# Patient Record
Sex: Female | Born: 1960 | Race: Black or African American | Hispanic: No | Marital: Single | Smoking: Former smoker
Health system: Southern US, Community
[De-identification: ages and names within clinical notes are randomized; demographics above are authoritative.]

## PROBLEM LIST (undated history)

## (undated) DIAGNOSIS — Z9114 Patient's other noncompliance with medication regimen: Secondary | ICD-10-CM

## (undated) DIAGNOSIS — F329 Major depressive disorder, single episode, unspecified: Secondary | ICD-10-CM

## (undated) DIAGNOSIS — F32A Depression, unspecified: Secondary | ICD-10-CM

## (undated) DIAGNOSIS — I10 Essential (primary) hypertension: Secondary | ICD-10-CM

## (undated) DIAGNOSIS — Z9289 Personal history of other medical treatment: Secondary | ICD-10-CM

## (undated) DIAGNOSIS — F17210 Nicotine dependence, cigarettes, uncomplicated: Secondary | ICD-10-CM

## (undated) DIAGNOSIS — Z91148 Patient's other noncompliance with medication regimen for other reason: Secondary | ICD-10-CM

## (undated) HISTORY — DX: Essential (primary) hypertension: I10

## (undated) HISTORY — DX: Nicotine dependence, cigarettes, uncomplicated: F17.210

## (undated) HISTORY — DX: Major depressive disorder, single episode, unspecified: F32.9

## (undated) HISTORY — PX: CHOLECYSTECTOMY: SHX55

## (undated) HISTORY — DX: Depression, unspecified: F32.A

---

## 2001-10-05 ENCOUNTER — Other Ambulatory Visit: Admission: RE | Admit: 2001-10-05 | Discharge: 2001-10-05 | Payer: Self-pay | Admitting: Obstetrics and Gynecology

## 2004-11-25 ENCOUNTER — Ambulatory Visit: Payer: Self-pay | Admitting: Family Medicine

## 2005-03-22 ENCOUNTER — Ambulatory Visit: Payer: Self-pay | Admitting: Family Medicine

## 2005-07-13 ENCOUNTER — Ambulatory Visit: Payer: Self-pay | Admitting: Family Medicine

## 2005-09-16 ENCOUNTER — Ambulatory Visit: Payer: Self-pay | Admitting: Family Medicine

## 2006-02-06 ENCOUNTER — Ambulatory Visit: Payer: Self-pay | Admitting: Family Medicine

## 2006-04-10 ENCOUNTER — Ambulatory Visit: Payer: Self-pay | Admitting: Family Medicine

## 2006-04-14 ENCOUNTER — Ambulatory Visit (HOSPITAL_COMMUNITY): Admission: RE | Admit: 2006-04-14 | Discharge: 2006-04-14 | Payer: Self-pay | Admitting: Family Medicine

## 2006-10-30 ENCOUNTER — Ambulatory Visit: Payer: Self-pay | Admitting: Family Medicine

## 2006-11-09 ENCOUNTER — Ambulatory Visit (HOSPITAL_COMMUNITY): Admission: RE | Admit: 2006-11-09 | Discharge: 2006-11-09 | Payer: Self-pay | Admitting: Family Medicine

## 2006-11-09 ENCOUNTER — Ambulatory Visit: Payer: Self-pay | Admitting: Family Medicine

## 2006-12-14 ENCOUNTER — Ambulatory Visit: Payer: Self-pay | Admitting: Family Medicine

## 2007-02-21 ENCOUNTER — Encounter (INDEPENDENT_AMBULATORY_CARE_PROVIDER_SITE_OTHER): Payer: Self-pay | Admitting: *Deleted

## 2007-02-21 LAB — CONVERTED CEMR LAB: Pap Smear: NORMAL

## 2007-12-06 ENCOUNTER — Encounter: Payer: Self-pay | Admitting: Family Medicine

## 2008-02-27 ENCOUNTER — Ambulatory Visit: Payer: Self-pay | Admitting: Family Medicine

## 2008-02-29 ENCOUNTER — Encounter: Payer: Self-pay | Admitting: Family Medicine

## 2008-02-29 LAB — CONVERTED CEMR LAB
BUN: 11 mg/dL (ref 6–23)
Calcium: 9.3 mg/dL (ref 8.4–10.5)
Chloride: 107 meq/L (ref 96–112)
Creatinine, Ser: 0.95 mg/dL (ref 0.40–1.20)
Glucose, Bld: 106 mg/dL — ABNORMAL HIGH (ref 70–99)
Hemoglobin: 12.7 g/dL (ref 12.0–15.0)
Lymphocytes Relative: 39 % (ref 12–46)
MCHC: 32.3 g/dL (ref 30.0–36.0)
Monocytes Absolute: 0.4 10*3/uL (ref 0.1–1.0)
Monocytes Relative: 9 % (ref 3–12)
Neutro Abs: 2.2 10*3/uL (ref 1.7–7.7)
Neutrophils Relative %: 51 % (ref 43–77)
Platelets: 180 10*3/uL (ref 150–400)
RBC: 5.22 M/uL — ABNORMAL HIGH (ref 3.87–5.11)
Sodium: 139 meq/L (ref 135–145)
WBC: 4.3 10*3/uL (ref 4.0–10.5)

## 2008-03-03 ENCOUNTER — Ambulatory Visit (HOSPITAL_COMMUNITY): Admission: RE | Admit: 2008-03-03 | Discharge: 2008-03-03 | Payer: Self-pay | Admitting: Family Medicine

## 2008-03-06 ENCOUNTER — Other Ambulatory Visit: Admission: RE | Admit: 2008-03-06 | Discharge: 2008-03-06 | Payer: Self-pay | Admitting: Obstetrics and Gynecology

## 2008-04-02 ENCOUNTER — Encounter (INDEPENDENT_AMBULATORY_CARE_PROVIDER_SITE_OTHER): Payer: Self-pay | Admitting: *Deleted

## 2008-04-02 DIAGNOSIS — I10 Essential (primary) hypertension: Secondary | ICD-10-CM | POA: Insufficient documentation

## 2008-04-02 DIAGNOSIS — L659 Nonscarring hair loss, unspecified: Secondary | ICD-10-CM | POA: Insufficient documentation

## 2008-04-02 DIAGNOSIS — F3289 Other specified depressive episodes: Secondary | ICD-10-CM | POA: Insufficient documentation

## 2008-04-02 DIAGNOSIS — F329 Major depressive disorder, single episode, unspecified: Secondary | ICD-10-CM

## 2008-04-03 ENCOUNTER — Encounter: Payer: Self-pay | Admitting: Family Medicine

## 2008-04-03 ENCOUNTER — Ambulatory Visit: Payer: Self-pay | Admitting: Family Medicine

## 2008-04-15 ENCOUNTER — Ambulatory Visit: Payer: Self-pay | Admitting: Family Medicine

## 2008-05-06 ENCOUNTER — Emergency Department (HOSPITAL_COMMUNITY): Admission: EM | Admit: 2008-05-06 | Discharge: 2008-05-06 | Payer: Self-pay | Admitting: Emergency Medicine

## 2008-05-15 ENCOUNTER — Ambulatory Visit: Payer: Self-pay | Admitting: Family Medicine

## 2008-08-15 ENCOUNTER — Encounter: Payer: Self-pay | Admitting: Family Medicine

## 2008-09-22 ENCOUNTER — Ambulatory Visit: Payer: Self-pay | Admitting: Family Medicine

## 2008-09-22 DIAGNOSIS — T7840XA Allergy, unspecified, initial encounter: Secondary | ICD-10-CM | POA: Insufficient documentation

## 2008-10-22 ENCOUNTER — Encounter: Payer: Self-pay | Admitting: Family Medicine

## 2008-10-23 ENCOUNTER — Ambulatory Visit: Payer: Self-pay | Admitting: Family Medicine

## 2008-12-15 ENCOUNTER — Encounter: Payer: Self-pay | Admitting: Family Medicine

## 2009-02-02 ENCOUNTER — Ambulatory Visit: Payer: Self-pay | Admitting: Family Medicine

## 2009-04-20 ENCOUNTER — Other Ambulatory Visit: Admission: RE | Admit: 2009-04-20 | Discharge: 2009-04-20 | Payer: Self-pay | Admitting: Obstetrics and Gynecology

## 2009-04-22 ENCOUNTER — Ambulatory Visit (HOSPITAL_COMMUNITY): Admission: RE | Admit: 2009-04-22 | Discharge: 2009-04-22 | Payer: Self-pay | Admitting: Obstetrics & Gynecology

## 2009-05-11 ENCOUNTER — Emergency Department (HOSPITAL_COMMUNITY): Admission: EM | Admit: 2009-05-11 | Discharge: 2009-05-11 | Payer: Self-pay | Admitting: Emergency Medicine

## 2009-05-13 ENCOUNTER — Telehealth: Payer: Self-pay | Admitting: Family Medicine

## 2009-05-15 ENCOUNTER — Encounter: Payer: Self-pay | Admitting: Family Medicine

## 2009-06-22 ENCOUNTER — Emergency Department (HOSPITAL_COMMUNITY): Admission: EM | Admit: 2009-06-22 | Discharge: 2009-06-22 | Payer: Self-pay | Admitting: Emergency Medicine

## 2010-01-08 ENCOUNTER — Telehealth: Payer: Self-pay | Admitting: Family Medicine

## 2010-01-08 ENCOUNTER — Ambulatory Visit: Payer: Self-pay | Admitting: Family Medicine

## 2010-01-08 DIAGNOSIS — R5381 Other malaise: Secondary | ICD-10-CM | POA: Insufficient documentation

## 2010-01-08 DIAGNOSIS — R5383 Other fatigue: Secondary | ICD-10-CM

## 2010-01-08 LAB — CONVERTED CEMR LAB
ALT: <8 units/L (ref 0–35)
BUN: 11 mg/dL (ref 6–23)
Basophils Relative: 1 % (ref 0–1)
Chloride: 105 meq/L (ref 96–112)
Glucose, Bld: 85 mg/dL (ref 70–99)
HCT: 37.9 % (ref 36.0–46.0)
Hemoglobin: 12 g/dL (ref 12.0–15.0)
Lymphocytes Relative: 31 % (ref 12–46)
Lymphs Abs: 1.5 10*3/uL (ref 0.7–4.0)
MCV: 76.3 fL — ABNORMAL LOW (ref 78.0–100.0)
Monocytes Absolute: 0.3 10*3/uL (ref 0.1–1.0)
Monocytes Relative: 7 % (ref 3–12)
Neutrophils Relative %: 62 % (ref 43–77)
Platelets: 206 10*3/uL (ref 150–400)
Potassium: 4.5 meq/L (ref 3.5–5.3)
RDW: 16.4 % — ABNORMAL HIGH (ref 11.5–15.5)
TSH: 1.226 microintl units/mL (ref 0.350–4.500)
Triglycerides: 74 mg/dL (ref <150)
VLDL: 15 mg/dL (ref 0–40)

## 2010-01-12 ENCOUNTER — Telehealth: Payer: Self-pay | Admitting: Family Medicine

## 2010-02-02 ENCOUNTER — Telehealth: Payer: Self-pay | Admitting: Family Medicine

## 2010-08-19 ENCOUNTER — Ambulatory Visit: Payer: Self-pay | Admitting: Family Medicine

## 2010-09-16 ENCOUNTER — Ambulatory Visit: Payer: Self-pay | Admitting: Family Medicine

## 2010-09-16 DIAGNOSIS — L723 Sebaceous cyst: Secondary | ICD-10-CM

## 2010-09-16 LAB — CONVERTED CEMR LAB
ALT: <8 units/L (ref 0–35)
AST: 11 units/L (ref 0–37)
Albumin: 4.3 g/dL (ref 3.5–5.2)
Alkaline Phosphatase: 61 units/L (ref 39–117)
Basophils Absolute: 0 10*3/uL (ref 0.0–0.1)
Basophils Relative: 1 % (ref 0–1)
Bilirubin, Direct: <0.1 mg/dL (ref 0.0–0.3)
Chloride: 105 meq/L (ref 96–112)
Cholesterol: 144 mg/dL (ref 0–200)
Eosinophils Absolute: 0 10*3/uL (ref 0.0–0.7)
Eosinophils Relative: 1 % (ref 0–5)
Hemoglobin: 12 g/dL (ref 12.0–15.0)
Lymphocytes Relative: 37 % (ref 12–46)
Lymphs Abs: 1.8 10*3/uL (ref 0.7–4.0)
MCV: 75.5 fL — ABNORMAL LOW (ref 78.0–100.0)
Neutro Abs: 2.6 10*3/uL (ref 1.7–7.7)
Neutrophils Relative %: 55 % (ref 43–77)
Platelets: 204 10*3/uL (ref 150–400)
RDW: 17.2 % — ABNORMAL HIGH (ref 11.5–15.5)
TSH: 1.47 microintl units/mL (ref 0.350–4.500)
VLDL: 14 mg/dL (ref 0–40)
WBC: 4.7 10*3/uL (ref 4.0–10.5)

## 2010-09-17 ENCOUNTER — Encounter: Payer: Self-pay | Admitting: Family Medicine

## 2010-11-18 ENCOUNTER — Ambulatory Visit: Payer: Self-pay | Admitting: Family Medicine

## 2010-11-19 ENCOUNTER — Encounter: Payer: Self-pay | Admitting: Family Medicine

## 2010-12-26 ENCOUNTER — Encounter: Payer: Self-pay | Admitting: Family Medicine

## 2011-01-04 NOTE — Letter (Signed)
Summary: consults  consults   Imported By: Curtis Sites 05/07/2010 10:33:00  _____________________________________________________________________  External Attachment:    Type:   Image     Comment:   External Document

## 2011-01-04 NOTE — Assessment & Plan Note (Signed)
Summary: OV   Vital Signs:  Patient profile:   50 year old female Height:      63.5 inches Weight:      167 pounds BMI:     29.22 O2 Sat:      95 % Pulse rate:   87 / minute Pulse rhythm:   regular Resp:     16 per minute BP sitting:   150 / 90  (left arm)  Vitals Entered By: Everitt Amber (January 08, 2010 10:22 AM)  Nutrition Counseling: Patient's BMI is greater than 25 and therefore counseled on weight management options. CC: sinus pressure, eyes watering, stress, ran out of bp pills   CC:  sinus pressure, eyes watering, stress, and ran out of bp pills.  History of Present Illness: Reports  that tshe has not been doing well. Denies recent fever or chills. Denies sinus pressure, nasal congestion , ear pain or sore throat. Denies chest congestion, or cough productive of sputum. Denies chest pain, palpitations, PND, orthopnea or leg swelling. Denies abdominal pain, nausea, vomitting, diarrhea or constipation. Denies change in bowel movements or bloody stool. Denies dysuria , frequency, incontinence or hesitancy. Denies  joint pain, swelling, or reduced mobility. Denies headaches, vertigo, seizures. Reports increeased depression and anxiety, she is not suicidal or homicidal. Denies  rash, lesions, or itch.Her allopecia is unchanged     Preventive Screening-Counseling & Management  Alcohol-Tobacco     Smoking Cessation Counseling: yes  Current Medications (verified): 1)  Amlodipine Besylate 5 Mg Tabs (Amlodipine Besylate) .... Take 1 Tablet By Mouth Once A Day 2)  Prozac 40 Mg Caps (Fluoxetine Hcl) .... Take 1 Capsule By Mouth Once A Day 3)  Ibuprofen 800 Mg Tabs (Ibuprofen) .... One Tab By Mouth Tid  Allergies (verified): No Known Drug Allergies  Review of Systems      See HPI Eyes:  Denies discharge and red eye. Endo:  Denies cold intolerance, excessive hunger, excessive thirst, excessive urination, heat intolerance, polyuria, and weight change. Heme:  Denies  abnormal bruising and bleeding. Allergy:  Denies hives or rash and itching eyes.  Physical Exam  General:  Well-developed,well-nourished,in no acute distress; alert,appropriate and cooperative throughout examination HEENT: No facial asymmetry,  EOMI, No sinus tenderness, TM's Clear, oropharynx  pink and moist.   Chest: Clear to auscultation bilaterally.  CVS: S1, S2, No murmurs, No S3.   Abd: Soft, Nontender.  MS: Adequate ROM spine, hips, shoulders and knees.  Ext: No edema.   CNS: CN 2-12 intact, power tone and sensation normal throughout.   Skin: Intact, no visible lesions or rashes.  Psych: Good eye contact, normal affect.  Memory intact, not anxious or depressed appearing.    Impression & Recommendations:  Problem # 1:  CONTRACEPTIVE MANAGEMENT (ICD-V25.09) Assessment Comment Only  Orders: Gynecologic Referral (Gyn)  Problem # 2:  DEPRESSION (ICD-311) Assessment: Deteriorated  The following medications were removed from the medication list:    Fluoxetine Hcl 20 Mg Caps (Fluoxetine hcl) ..... One cap by mouth qd    Pristiq 50 Mg Xr24h-tab (Desvenlafaxine succinate) .Marland Kitchen... Take 1 tablet by mouth once a day Her updated medication list for this problem includes:    Prozac 40 Mg Caps (Fluoxetine hcl) .Marland Kitchen... Take 1 capsule by mouth once a day  Problem # 3:  HYPERTENSION (ICD-401.9) Assessment: Deteriorated  Her updated medication list for this problem includes:    Amlodipine Besylate 5 Mg Tabs (Amlodipine besylate) .Marland Kitchen... Take 1 tablet by mouth once a day  Orders: T-Basic Metabolic Panel [phone])  BP today: 150/90 Prior BP: 120/70 (02/02/2009)  Labs Reviewed: K+: 4.1 (02/29/2008) Creat: : 0.95 (02/29/2008)   Chol: 146 (02/29/2008)   HDL: 47 (02/29/2008)   LDL: 80 (02/29/2008)   TG: 96 (02/29/2008)  Problem # 4:  ALOPECIA (ICD-704.00) Assessment: Unchanged  Complete Medication List: 1)  Amlodipine Besylate 5 Mg Tabs (Amlodipine besylate) .... Take 1 tablet by  mouth once a day 2)  Prozac 40 Mg Caps (Fluoxetine hcl) .... Take 1 capsule by mouth once a day 3)  Ibuprofen 800 Mg Tabs (Ibuprofen) .... One tab by mouth tid 4)  Zyrtec Hives Relief 10 Mg Tabs (Cetirizine hcl) .... Take 1 tablet by mouth once a day  Other Orders: T-Hepatic Function [phone]) T-Lipid Profile [phone]) T-CBC w/Diff [phone]) T-TSH [phone]) Radiology Referral (Radiology)  Patient Instructions: 1)  Please schedule a follow-up appointment in 3.5 months. 2)  Tobacco is very bad for your health and your loved ones! You Should stop smoking!. 3)  Stop Smoking Tips: Choose a Quit date. Cut down before the Quit date. decide what you will do as a substitute when you feel the urge to smoke(gum,toothpick,exercise). 4)  BMP prior to visit, ICD-9: 5)  Hepatic Panel prior to visit, ICD-9: 6)  Lipid Panel prior to visit, ICD-9:   FASTING TODAY 7)  TSH prior to visit, ICD-9: 8)  CBC w/ Diff prior to visit, ICD-9: 9)  YOUR BLOOD PRESSURE IS HIGH PLS TASKE YOUR MEDS Prescriptions: ZYRTEC HIVES RELIEF 10 MG TABS (CETIRIZINE HCL) Take 1 tablet by mouth once a day  #30 x 4   Entered and Authorized by:   Syliva Overman MD   Signed by:   Syliva Overman MD on 01/08/2010   Method used:   Electronically to        [facility] [address] [phone]* (retail)       [address], [place]  [address]       Ph: [phone]       Fax: [phone]   RxID:   [id number]

## 2011-01-04 NOTE — Letter (Signed)
Summary: history and physical  history and physical   Imported By: Curtis Sites 05/07/2010 10:33:47  _____________________________________________________________________  External Attachment:    Type:   Image     Comment:   External Document

## 2011-01-04 NOTE — Letter (Signed)
Summary: progress notes  progress notes   Imported By: Curtis Sites 05/07/2010 10:34:48  _____________________________________________________________________  External Attachment:    Type:   Image     Comment:   External Document

## 2011-01-04 NOTE — Assessment & Plan Note (Signed)
Summary: office visit   Vital Signs:  Patient profile:   50 year old female Height:      63.5 inches Weight:      157.25 pounds BMI:     27.52 O2 Sat:      97 % Pulse rate:   66 / minute Pulse rhythm:   regular Resp:     16 per minute BP sitting:   110 / 70  (left arm)  Vitals Entered By: Everitt Amber LPN (September 16, 2010 10:52 AM)  Nutrition Counseling: Patient's BMI is greater than 25 and therefore counseled on weight management options. CC: Follow up chronic problems   CC:  Follow up chronic problems.  History of Present Illness: 1 week h/o painfukl redswelling on right side of face in front of the ear, cannot touch the area., no fever, chills or drainage. Pt otherwise has no concerns. ll. Denies recent fever or chills. Denies sinus pressure, nasal congestion , ear pain or sore throat. Denies chest congestion, or cough productive of sputum. Denies chest pain, palpitations, PND, orthopnea or leg swelling. Denies abdominal pain, nausea, vomitting, diarrhea or constipation. Denies change in bowel movements or bloody stool. Denies dysuria , frequency, incontinence or hesitancy. Denies  joint pain, swelling, or reduced mobility. Denies headaches, vertigo, seizures. Denies depression, anxiety or insomnia.Symtoms cobntrolled on medication.    Allergies (verified): No Known Drug Allergies  Review of Systems      See HPI General:  Complains of fatigue. Eyes:  Denies discharge and red eye. Endo:  Denies excessive thirst and excessive urination. Heme:  Denies abnormal bruising and bleeding. Allergy:  Complains of seasonal allergies; denies hives or rash and itching eyes.  Physical Exam  General:  Well-developed,well-nourished,in no acute distress; alert,appropriate and cooperative throughout examination HEENT: No facial asymmetry,  EOMI, No sinus tenderness, TM's Clear, oropharynx  pink and moist.   Chest: Clear to auscultation bilaterally. decreased air entry  throughout CVS: S1, S2, No murmurs, No S3.   Abd: Soft, Nontender.  MS: Adequate ROM spine, hips, shoulders and knees.  Ext: No edema.   CNS: CN 2-12 intact, power tone and sensation normal throughout.   Skin:cellulitis with infected cyst anterior to right ear, area involved approx 3 cm, improvement in allopecia Psych: Good eye contact, normal affect.  Memory intact, depressed appearing.    Impression & Recommendations:  Problem # 1:  SEBACEOUS CYST, INFECTED (ICD-706.2) Assessment Comment Only  Orders: Rocephin  250mg  (Z6109) Admin of Therapeutic Inj  intramuscular or subcutaneous (96372)Future Orders: Surgical Referral (Surgery) ... 09/17/2010  Problem # 2:  DEPRESSION (ICD-311) Assessment: Improved  Her updated medication list for this problem includes:    Prozac 40 Mg Caps (Fluoxetine hcl) .Marland Kitchen... Take 1 capsule by mouth once a day  Problem # 3:  HYPERTENSION (ICD-401.9) Assessment: Improved  Her updated medication list for this problem includes:    Amlodipine Besylate 5 Mg Tabs (Amlodipine besylate) .Marland Kitchen... Take 1 tablet by mouth once a day  BP today: 110/70 Prior BP: 170/98 (08/19/2010)  Labs Reviewed: K+: 4.5 (01/08/2010) Creat: : 0.89 (01/08/2010)   Chol: 169 (01/08/2010)   HDL: 45 (01/08/2010)   LDL: 109 (01/08/2010)   TG: 74 (01/08/2010)  Problem # 4:  ALOPECIA (ICD-704.00) Assessment: Improved  Complete Medication List: 1)  Amlodipine Besylate 5 Mg Tabs (Amlodipine besylate) .... Take 1 tablet by mouth once a day 2)  Prozac 40 Mg Caps (Fluoxetine hcl) .... Take 1 capsule by mouth once a day 3)  Ibuprofen 800  Mg Tabs (Ibuprofen) .... One tab by mouth tid 4)  Zyrtec Hives Relief 10 Mg Tabs (Cetirizine hcl) .... Take 1 tablet by mouth once a day 5)  Doxycycline Hyclate 100 Mg Caps (Doxycycline hyclate) .... Take 1 capsule by mouth two times a day 6)  Advil 200 Mg Tabs (Ibuprofen) .... 2 tablets evry 8 hours as needed for pain for the next 5 days  Patient  Instructions: 1)  F/U as before 2)  You have an infected cyst on your face . 3)  It is vital you get this drained soon, I will refer you to a surgeon. 4)  You will get an injection, of Rocephin in the office , and antibiotics are also sent to your pharmacy , yo need to start these today. 5)  Your blood pressure is good. 6)  We will give you a tylenol, in the office and 2 advils for pain, also and  you will get advil samples take 2 tablet every 8 hors for the next 3 to 5 days as needed for pain Prescriptions: ADVIL 200 MG TABS (IBUPROFEN) 2 tablets evry 8 hours as needed for pain for the next 5 days  #24 x 0   Entered and Authorized by:   Syliva Overman MD   Signed by:   Syliva Overman MD on 09/16/2010   Method used:   Samples Given   RxID:   [id number] DOXYCYCLINE HYCLATE 100 MG CAPS (DOXYCYCLINE HYCLATE) Take 1 capsule by mouth two times a day  #20 x 0   Entered and Authorized by:   Syliva Overman MD   Signed by:   Syliva Overman MD on 09/16/2010   Method used:   Electronically to        [facility]* (retail)       [address]       [place], [place]  [address]       Ph: [phone]       Fax: [phone]   RxID:   [id number]    Medication Administration  Injection # 1:    Medication: Rocephin  250mg     Diagnosis: SEBACEOUS CYST, INFECTED (ICD-706.2)    Route: IM    Site: RUOQ gluteus    Exp Date: 12/2012    Lot #: [id number]    Mfr: novaplus    Comments: 500 mg given    Patient tolerated injection without complications    Given by: Mauricia Area CMA (September 16, 2010 11:58 AM)  Orders Added: 1)  Est. Patient Level IV [96789] 2)  Rocephin  250mg  [J0696] 3)  Admin of Therapeutic Inj  intramuscular or subcutaneous [96372] 4)  Surgical Referral [Surgery]

## 2011-01-04 NOTE — Progress Notes (Signed)
Summary: [facility]  Phone Note Call from Patient   Summary of Call: [facility] CALLED AND SAID THAT HAD CALLED HE 1 LEFT MESSAGE TO CALL BACK AND THEN CALLED AGAIN AND # DISCONNECTED SO IF SHE NEEDS TO BE REFFERED AGAIN JUST REDO IT Initial call taken by: Lind Guest,  February 02, 2010 3:13 PM

## 2011-01-04 NOTE — Letter (Signed)
Summary: phone notes  phone notes   Imported By: Curtis Sites 05/07/2010 10:34:21  _____________________________________________________________________  External Attachment:    Type:   Image     Comment:   External Document

## 2011-01-04 NOTE — Assessment & Plan Note (Signed)
Summary: [facility]   Vital Signs:  Patient profile:   50 year old female Height:      63.5 inches Weight:      153.75 pounds BMI:     26.91 O2 Sat:      100 % on Room air Pulse rate:   82 / minute Pulse rhythm:   regular Resp:     16 per minute BP sitting:   170 / 98  (left arm)  Vitals Entered By: Adella Hare LPN (August 19, 2010 11:37 AM)  Nutrition Counseling: Patient's BMI is greater than 25 and therefore counseled on weight management options.  O2 Flow:  Room air CC: follow-up visit Is Patient Diabetic? No Pain Assessment Patient in pain? no      Comments did not bring meds to [facility]   CC:  follow-up visit.  History of Present Illness: Reports  thatsh has not been doing well, she is overwhelmed and stressed because of no money. She ran out of her bP meds 3 days ago. Denies recent fever or chills. Denies sinus pressure, nasal congestion , ear pain or sore throat. Denies chest congestion, or cough productive of sputum. Denies chest pain, palpitations, PND, orthopnea or leg swelling. Denies abdominal pain, nausea, vomitting, diarrhea or constipation. Denies change in bowel movements or bloody stool. Denies dysuria , frequency, incontinence or hesitancy. Denies  joint pain, swelling, or reduced mobility. Denies headaches, vertigo, seizures. Denies depression, anxiety or insomnia. Reports improvement in her allo[pecia.   Current Medications (verified): 1)  Amlodipine Besylate 5 Mg Tabs (Amlodipine Besylate) .... Take 1 Tablet By Mouth Once A Day 2)  Prozac 40 Mg Caps (Fluoxetine Hcl) .... Take 1 Capsule By Mouth Once A Day 3)  Ibuprofen 800 Mg Tabs (Ibuprofen) .... One Tab By Mouth Tid 4)  Zyrtec Hives Relief 10 Mg Tabs (Cetirizine Hcl) .... Take 1 Tablet By Mouth Once A Day  Allergies (verified): No Known Drug Allergies  Review of Systems      See HPI General:  Complains of fatigue and sleep disorder. Eyes:  Denies discharge, eye pain, and red eye. Psych:   Complains of anxiety, depression, and irritability.  Physical Exam  General:  Well-developed,well-nourished,in no acute distress; alert,appropriate and cooperative throughout examination HEENT: No facial asymmetry,  EOMI, No sinus tenderness, TM's Clear, oropharynx  pink and moist.   Chest: Clear to auscultation bilaterally. decreased air entry throughout CVS: S1, S2, No murmurs, No S3.   Abd: Soft, Nontender.  MS: Adequate ROM spine, hips, shoulders and knees.  Ext: No edema.   CNS: CN 2-12 intact, power tone and sensation normal throughout.   Skin: Intact, improvement in allopecia Psych: Good eye contact, normal affect.  Memory intact, depressed appearing.    Impression & Recommendations:  Problem # 1:  DEPRESSION (ICD-311) Assessment Deteriorated  Her updated medication list for this problem includes:    Prozac 40 Mg Caps (Fluoxetine hcl) .Marland Kitchen... Take 1 capsule by mouth once a day  Problem # 2:  ALOPECIA (ICD-704.00) Assessment: Improved  Problem # 3:  HYPERTENSION (ICD-401.9) Assessment: Deteriorated  Her updated medication list for this problem includes:    Amlodipine Besylate 5 Mg Tabs (Amlodipine besylate) .Marland Kitchen... Take 1 tablet by mouth once a day Check your Blood Pressure regularly. If it is above150/95  you should make an appointment.  BP today: 170/98 Prior BP: 150/90 (01/08/2010)  Labs Reviewed: K+: 4.5 (01/08/2010) Creat: : 0.89 (01/08/2010)   Chol: 169 (01/08/2010)   HDL: 45 (01/08/2010)  LDL: 109 (01/08/2010)   TG: 74 (01/08/2010)  Complete Medication List: 1)  Amlodipine Besylate 5 Mg Tabs (Amlodipine besylate) .... Take 1 tablet by mouth once a day 2)  Prozac 40 Mg Caps (Fluoxetine hcl) .... Take 1 capsule by mouth once a day 3)  Ibuprofen 800 Mg Tabs (Ibuprofen) .... One tab by mouth tid 4)  Zyrtec Hives Relief 10 Mg Tabs (Cetirizine hcl) .... Take 1 tablet by mouth once a day  Other Orders: Influenza Vaccine MCR (04540)  Patient Instructions: 1)   Follow up appointment in 5.33months 2)  Meds are sent in. 3)  I hope things get better   Orders Added: 1)  Influenza Vaccine MCR [00025] 2)  Est. Patient Level III [98119]    Influenza Vaccine    Vaccine Type: Fluvax MCR    Site: left deltoid    Mfr: novartis    Dose: 0.5 ml    Route: IM    Given by: Adella Hare LPN    Exp. Date: 205/2012    Lot #: [id number]    VIS given: 06/29/10 version given August 19, 2010.

## 2011-01-04 NOTE — Letter (Addendum)
Summary: med review sheet  med review sheet   Imported By: Rudene Anda 09/17/2010 16:24:37  _____________________________________________________________________  External Attachments:     1. Type:   Image          Comment:   External Document    2. Type:   Image          Comment:   External Document

## 2011-01-04 NOTE — Progress Notes (Signed)
Summary: meds  Phone Note Call from Patient   Summary of Call: needs her bp medicine and stress pills and sinus pills send to [facility] Initial call taken by: Lind Guest,  January 12, 2010 4:25 PM    Prescriptions: ZYRTEC HIVES RELIEF 10 MG TABS (CETIRIZINE HCL) Take 1 tablet by mouth once a day  #30 x 4   Entered by:   Everitt Amber   Authorized by:   Syliva Overman MD   Signed by:   Everitt Amber on 01/12/2010   Method used:   Electronically to        [facility]* (retail)       [address] [place], [place]  [address]       Ph: [phone]       Fax: [phone]   RxID:   [id number] PROZAC 40 MG CAPS (FLUOXETINE HCL) Take 1 capsule by mouth once a day  #30.0 Each x 4   Entered by:   Everitt Amber   Authorized by:   Syliva Overman MD   Signed by:   Everitt Amber on 01/12/2010   Method used:   Electronically to        [facility]* (retail)       [address] [place], [place]  [address]       Ph: [phone]       Fax: [phone]   RxID:   [id number] AMLODIPINE BESYLATE 5 MG TABS (AMLODIPINE BESYLATE) Take 1 tablet by mouth once a day  #30.0 Each x 4   Entered by:   Everitt Amber   Authorized by:   Syliva Overman MD   Signed by:   Everitt Amber on 01/12/2010   Method used:   Electronically to        [facility]* (retail)       [address] [place], [place]  [address]       Ph: [phone]       Fax: [phone]   RxID:   [id number]

## 2011-01-04 NOTE — Letter (Signed)
Summary: misc.  misc.   Imported By: Curtis Sites 05/07/2010 10:34:03  _____________________________________________________________________  External Attachment:    Type:   Image     Comment:   External Document

## 2011-01-04 NOTE — Letter (Signed)
Summary: demographic  demographic   Imported By: Curtis Sites 05/07/2010 10:36:23  _____________________________________________________________________  External Attachment:    Type:   Image     Comment:   External Document

## 2011-01-04 NOTE — Letter (Signed)
Summary: lab  lab   Imported By: Curtis Sites 05/07/2010 10:33:27  _____________________________________________________________________  External Attachment:    Type:   Image     Comment:   External Document

## 2011-01-04 NOTE — Letter (Signed)
Summary: xray  xray   Imported By: Curtis Sites 05/07/2010 10:36:40  _____________________________________________________________________  External Attachment:    Type:   Image     Comment:   External Document

## 2011-01-04 NOTE — Progress Notes (Signed)
Summary: REFERRAL  Phone Note Call from Patient   Summary of Call: NEEDS A REFERALL TO DR. Joana Reamer BACK AT [phone] Initial call taken by: Lind Guest,  January 08, 2010 1:33 PM  Follow-up for Phone Call        has to go for her birth control ( i believe it is an iud) been 63yrs, so she will need a referal Follow-up by: Worthy Keeler LPN,  January 08, 2010 1:37 PM  Additional Follow-up for Phone Call Additional follow up Details #1::        will add to ov Additional Follow-up by: Syliva Overman MD,  January 11, 2010 1:02 AM

## 2011-01-06 NOTE — Letter (Signed)
Summary: 1st missed letter  1st missed letter   Imported By: Lind Guest 11/19/2010 14:18:36  _____________________________________________________________________  External Attachment:    Type:   Image     Comment:   External Document

## 2011-02-01 ENCOUNTER — Encounter: Payer: Self-pay | Admitting: Family Medicine

## 2011-02-01 ENCOUNTER — Ambulatory Visit: Payer: Self-pay | Admitting: Family Medicine

## 2011-02-03 ENCOUNTER — Encounter: Payer: Self-pay | Admitting: Family Medicine

## 2011-02-08 ENCOUNTER — Encounter: Payer: Self-pay | Admitting: Family Medicine

## 2011-02-10 NOTE — Letter (Signed)
Summary: discharge letter  discharge letter   Imported By: Rudene Anda 02/03/2011 08:44:52  _____________________________________________________________________  External Attachment:    Type:   Image     Comment:   External Document

## 2011-02-10 NOTE — Letter (Signed)
Summary: no show letter   no show letter   Imported By: Rudene Anda 02/03/2011 08:52:41  _____________________________________________________________________  External Attachment:    Type:   Image     Comment:   External Document

## 2011-02-15 NOTE — Letter (Signed)
Summary: receipt certified mail  receipt certified mail   Imported By: Lind Guest 02/08/2011 07:57:29  _____________________________________________________________________  External Attachment:    Type:   Image     Comment:   External Document

## 2011-04-11 ENCOUNTER — Other Ambulatory Visit: Payer: Self-pay | Admitting: Family Medicine

## 2011-05-10 ENCOUNTER — Other Ambulatory Visit: Payer: Self-pay | Admitting: Family Medicine

## 2011-09-01 LAB — URINALYSIS, ROUTINE W REFLEX MICROSCOPIC
Glucose, UA: NEGATIVE
Specific Gravity, Urine: 1.01
pH: 6.5

## 2011-09-01 LAB — URINE MICROSCOPIC-ADD ON

## 2011-09-01 LAB — URINE CULTURE

## 2011-09-01 LAB — PREGNANCY, URINE: Preg Test, Ur: NEGATIVE

## 2012-03-30 ENCOUNTER — Other Ambulatory Visit (HOSPITAL_COMMUNITY): Payer: Self-pay

## 2012-04-04 DIAGNOSIS — Z9289 Personal history of other medical treatment: Secondary | ICD-10-CM

## 2012-04-04 HISTORY — DX: Personal history of other medical treatment: Z92.89

## 2012-04-06 ENCOUNTER — Ambulatory Visit (HOSPITAL_COMMUNITY)
Admission: RE | Admit: 2012-04-06 | Discharge: 2012-04-06 | Disposition: A | Discharge status: Home / Self Care | Payer: Medicaid Other | Source: Ambulatory Visit | Attending: Family Medicine | Admitting: Family Medicine

## 2012-04-06 DIAGNOSIS — I10 Essential (primary) hypertension: Secondary | ICD-10-CM | POA: Insufficient documentation

## 2012-04-06 DIAGNOSIS — R079 Chest pain, unspecified: Secondary | ICD-10-CM | POA: Insufficient documentation

## 2012-04-06 DIAGNOSIS — R9431 Abnormal electrocardiogram [ECG] [EKG]: Secondary | ICD-10-CM | POA: Insufficient documentation

## 2012-04-06 DIAGNOSIS — I517 Cardiomegaly: Secondary | ICD-10-CM

## 2012-04-06 NOTE — Progress Notes (Signed)
*  PRELIMINARY RESULTS* Echocardiogram 2D Echocardiogram has been performed.  Conrad [place] 04/06/2012, 9:29 AM

## 2013-11-27 ENCOUNTER — Emergency Department (HOSPITAL_COMMUNITY): Payer: Medicaid Other

## 2013-11-27 ENCOUNTER — Encounter (HOSPITAL_COMMUNITY): Payer: Self-pay | Admitting: Emergency Medicine

## 2013-11-27 ENCOUNTER — Emergency Department (HOSPITAL_COMMUNITY)
Admission: EM | Admit: 2013-11-27 | Discharge: 2013-11-27 | Disposition: A | Discharge status: Home / Self Care | Payer: Medicaid Other | Attending: Emergency Medicine | Admitting: Emergency Medicine

## 2013-11-27 DIAGNOSIS — Z791 Long term (current) use of non-steroidal anti-inflammatories (NSAID): Secondary | ICD-10-CM | POA: Insufficient documentation

## 2013-11-27 DIAGNOSIS — R51 Headache: Secondary | ICD-10-CM | POA: Insufficient documentation

## 2013-11-27 DIAGNOSIS — Z872 Personal history of diseases of the skin and subcutaneous tissue: Secondary | ICD-10-CM | POA: Insufficient documentation

## 2013-11-27 DIAGNOSIS — F329 Major depressive disorder, single episode, unspecified: Secondary | ICD-10-CM | POA: Insufficient documentation

## 2013-11-27 DIAGNOSIS — Z792 Long term (current) use of antibiotics: Secondary | ICD-10-CM | POA: Insufficient documentation

## 2013-11-27 DIAGNOSIS — Z9119 Patient's noncompliance with other medical treatment and regimen: Secondary | ICD-10-CM | POA: Insufficient documentation

## 2013-11-27 DIAGNOSIS — Z79899 Other long term (current) drug therapy: Secondary | ICD-10-CM | POA: Insufficient documentation

## 2013-11-27 DIAGNOSIS — F17200 Nicotine dependence, unspecified, uncomplicated: Secondary | ICD-10-CM | POA: Insufficient documentation

## 2013-11-27 DIAGNOSIS — F3289 Other specified depressive episodes: Secondary | ICD-10-CM | POA: Insufficient documentation

## 2013-11-27 DIAGNOSIS — J329 Chronic sinusitis, unspecified: Secondary | ICD-10-CM

## 2013-11-27 DIAGNOSIS — R110 Nausea: Secondary | ICD-10-CM | POA: Insufficient documentation

## 2013-11-27 DIAGNOSIS — G8929 Other chronic pain: Secondary | ICD-10-CM

## 2013-11-27 DIAGNOSIS — I10 Essential (primary) hypertension: Secondary | ICD-10-CM

## 2013-11-27 DIAGNOSIS — Z760 Encounter for issue of repeat prescription: Secondary | ICD-10-CM | POA: Insufficient documentation

## 2013-11-27 DIAGNOSIS — Z91199 Patient's noncompliance with other medical treatment and regimen due to unspecified reason: Secondary | ICD-10-CM | POA: Insufficient documentation

## 2013-11-27 HISTORY — DX: Patient's other noncompliance with medication regimen: Z91.14

## 2013-11-27 HISTORY — DX: Patient's other noncompliance with medication regimen for other reason: Z91.148

## 2013-11-27 HISTORY — DX: Personal history of other medical treatment: Z92.89

## 2013-11-27 LAB — POCT I-STAT, CHEM 8
Chloride: 102 mEq/L (ref 96–112)
Creatinine, Ser: 1.1 mg/dL (ref 0.50–1.10)
HCT: 41 % (ref 36.0–46.0)
Hemoglobin: 13.9 g/dL (ref 12.0–15.0)
Potassium: 3.4 mEq/L — ABNORMAL LOW (ref 3.5–5.1)
Sodium: 142 mEq/L (ref 135–145)

## 2013-11-27 LAB — URINALYSIS W MICROSCOPIC + REFLEX CULTURE
Bilirubin Urine: NEGATIVE
Glucose, UA: NEGATIVE mg/dL
Ketones, ur: NEGATIVE mg/dL
Nitrite: NEGATIVE
pH: 7.5 (ref 5.0–8.0)

## 2013-11-27 MED ORDER — OXYCODONE-ACETAMINOPHEN 5-325 MG PO TABS
1.0000 | ORAL_TABLET | Freq: Once | ORAL | Status: AC
  Administered 2013-11-27: 1 via ORAL
  Filled 2013-11-27: qty 1

## 2013-11-27 MED ORDER — ONDANSETRON 8 MG PO TBDP
8.0000 mg | ORAL_TABLET | Freq: Once | ORAL | Status: AC
  Administered 2013-11-27: 8 mg via ORAL
  Filled 2013-11-27: qty 1

## 2013-11-27 MED ORDER — AMLODIPINE BESYLATE 5 MG PO TABS: 5.0000 mg | ORAL_TABLET | Freq: Every day | ORAL | Status: DC

## 2013-11-27 NOTE — ED Notes (Signed)
Dr. Clarene Duke in to assess patient.  [facility], radiology, here to transport patient to CT.

## 2013-11-27 NOTE — ED Notes (Signed)
Patient c/o abdominal pain; Dr. Clarene Duke aware.  Patient is vague with symptoms and pain during MD assessment.  Patient states she needs to go to bathroom.

## 2013-11-27 NOTE — ED Provider Notes (Signed)
CSN: [id number]     Arrival date & time 11/27/13  1855 History   First MD Initiated Contact with Patient 11/27/13 1913     Chief Complaint  Patient presents with  . Headache  . Medication Refill    HPI Pt was seen at 1915. Per pt, c/o gradual onset and persistence of constant headache for the past 4 months.  Describes the headache as "pressure" and located in her forehead area. Has been associated with runny/stuffy nose, sinus congestion, nausea and "hot flashes" over the past 2 to 3 days. States she started to take OTC "sinus congestion medicine" for same. Pt states after starting the OTC "sinus medication" she had a neighbor check her BP and "she told me it was high." Endorses she has not taken her BP meds in over 1 year and is requesting a refill today.  Denies headache was sudden or maximal in onset or at any time.  Denies visual changes, no focal motor weakness, no tingling/numbness in extremities, no fevers, no neck pain, no rash, no CP/SOB, no abd pain, no vomiting/diarrhea.    Past Medical History  Diagnosis Date  . Depression   . Alopecia   . Cigarette smoker   . Hypertension   . Non compliance w medication regimen   . History of echocardiogram 04/2012    normal EF, no regional wall motion abnormalities   History reviewed. No pertinent past surgical history.   Family History  Problem Relation Age of Onset  . Diabetes Mother     DJD   History  Substance Use Topics  . Smoking status: Current Every Day Smoker  . Smokeless tobacco: Not on file  . Alcohol Use: No    Review of Systems ROS: Statement: All systems negative except as marked or noted in the HPI; Constitutional: Negative for fever and chills. ; ; Eyes: Negative for eye pain, redness and discharge. ; ; ENMT: Negative for ear pain, hoarseness, sore throat. +nasal congestion, sinus pressure. ; ; Cardiovascular: Negative for chest pain, palpitations, diaphoresis, dyspnea and peripheral edema. ; ; Respiratory: Negative  for cough, wheezing and stridor. ; ; Gastrointestinal: +nausea. Negative for vomiting, diarrhea, abdominal pain, blood in stool, hematemesis, jaundice and rectal bleeding. . ; ; Genitourinary: Negative for dysuria, flank pain and hematuria. ; ; Musculoskeletal: Negative for back pain and neck pain. Negative for swelling and trauma.; ; Skin: Negative for pruritus, rash, abrasions, blisters, bruising and skin lesion.; ; Neuro: +frontal headache. Negative for lightheadedness and neck stiffness. Negative for weakness, altered level of consciousness , altered mental status, extremity weakness, paresthesias, involuntary movement, seizure and syncope.      Allergies  Review of patient's allergies indicates no known allergies.  Home Medications   Current Outpatient Rx  Name  Route  Sig  Dispense  Refill  . amLODipine (NORVASC) 5 MG tablet   Oral   Take 5 mg by mouth daily. Take one tablet by mouth once a day          . cetirizine (ZYRTEC HIVES RELIEF) 10 MG tablet   Oral   Take 10 mg by mouth daily. Take one tablet by mouth once a day          . doxycycline (VIBRAMYCIN) 100 MG capsule   Oral   Take 100 mg by mouth daily. Take one tablet by mouth two times a day          . FLUoxetine (PROZAC) 40 MG capsule   Oral   Take 40  mg by mouth daily. Take one capsule  by mouth two times a day          . ibuprofen (ADVIL) 200 MG tablet   Oral   Take 200 mg by mouth every 8 (eight) hours as needed. Take two tablets by mouth every 8 hours as needed for pain for the next 5 days          . ibuprofen (ADVIL,MOTRIN) 800 MG tablet   Oral   Take 800 mg by mouth 3 (three) times daily. One tablet by mouth TID             BP 239/81  Pulse 83  Temp(Src) 98.2 F (36.8 C) (Oral)  Resp 18  Ht 5\' 4"  (1.626 m)  Wt 164 lb (74.39 kg)  BMI 28.14 kg/m2  SpO2 100% Physical Exam 1920: Physical examination:  Nursing notes reviewed; Vital signs and O2 SAT reviewed;  Constitutional: Well developed,  Well nourished, Well hydrated, In no acute distress; Head:  Normocephalic, atraumatic; Eyes: EOMI, PERRL, No scleral icterus; ENMT: TM's clear bilat. +edemetous nasal turbinates bilat with clear rhinorrhea. +tender to percuss frontal and maxillary sinuses.  Mouth and pharynx normal, Mucous membranes moist; Neck: Supple, Full range of motion, No lymphadenopathy; Cardiovascular: Regular rate and rhythm, No murmur, rub, or gallop; Respiratory: Breath sounds clear & equal bilaterally, No rales, rhonchi, wheezes.  Speaking full sentences with ease, Normal respiratory effort/excursion; Chest: Nontender, Movement normal; Abdomen: Soft, Nontender, Nondistended, Normal bowel sounds; Genitourinary: No CVA tenderness; Extremities: Pulses normal, No tenderness, No edema, No calf edema or asymmetry.; Neuro: AA&Ox3, Major CN grossly intact.  Speech clear. No facial droop. Climbs on and off stretcher easily by herself. Gait steady. No gross focal motor or sensory deficits in extremities.; Skin: Color normal, Warm, Dry.   ED Course  Procedures     EKG Interpretation   None       MDM  MDM Reviewed: previous chart, nursing note and vitals Interpretation: labs and CT scan     Ct Head Wo Contrast 11/27/2013   CLINICAL DATA:  Headache.  EXAM: CT HEAD WITHOUT CONTRAST  TECHNIQUE: Contiguous axial images were obtained from the base of the skull through the vertex without intravenous contrast.  COMPARISON:  None.  FINDINGS: The ventricles and sulci are within normal limits for age. There is no evidence of acute infarct, intracranial hemorrhage, mass, midline shift, or extra-axial collection. The orbits are unremarkable. There is partial opacification of a single posterior right ethmoid air cell. The visualized paranasal sinuses and mastoid air cells are otherwise clear. There is no evidence of acute fracture.  IMPRESSION: No acute intracranial abnormality.   Electronically Signed   By: Sebastian Ache   On: 11/27/2013  19:29     Results for orders placed during the hospital encounter of 11/27/13  URINALYSIS W MICROSCOPIC + REFLEX CULTURE      Result Value Range   Color, Urine YELLOW  YELLOW   APPearance HAZY (*) CLEAR   Specific Gravity, Urine 1.020  1.005 - 1.030   pH 7.5  5.0 - 8.0   Glucose, UA NEGATIVE  NEGATIVE mg/dL   Hgb urine dipstick SMALL (*) NEGATIVE   Bilirubin Urine NEGATIVE  NEGATIVE   Ketones, ur NEGATIVE  NEGATIVE mg/dL   Protein, ur 30 (*) NEGATIVE mg/dL   Urobilinogen, UA 0.2  0.0 - 1.0 mg/dL   Nitrite NEGATIVE  NEGATIVE   Leukocytes, UA NEGATIVE  NEGATIVE   WBC, UA 0-2  <3 WBC/hpf  RBC / HPF 3-6  <3 RBC/hpf   Bacteria, UA FEW (*) RARE   Squamous Epithelial / LPF RARE  RARE   Urine-Other AMORPHOUS URATES/PHOSPHATES    POCT I-STAT, CHEM 8      Result Value Range   Sodium 142  135 - 145 mEq/L   Potassium 3.4 (*) 3.5 - 5.1 mEq/L   Chloride 102  96 - 112 mEq/L   BUN 7  6 - 23 mg/dL   Creatinine, Ser 0.98  0.50 - 1.10 mg/dL   Glucose, Bld 119 (*) 70 - 99 mg/dL   Calcium, Ion 1.47  1.12 - 1.23 mmol/L   TCO2 28  0 - 100 mmol/L   Hemoglobin 13.9  12.0 - 15.0 g/dL   HCT 82.9  56.2 - 13.0 %     2120:  BP improved after pain control. Pt has had Hgb and protein in urine since 2009; per EPIC chart review.  Pt has been ambulatory while in the ED with steady gait, easy resps, NAD. Denies CP/SOB. Neuro exam unchanged. Has tol PO well without N/V. No stooling while in the ED. Abd remains benign. Pt states she feels better after meds and wants to go home now. Will refill pt's BP med; strongly encouraged to f/u with PMD for good continuity of care and control of her BP. Pt verb understanding. Dx and testing d/w pt.  Questions answered.  Verb understanding, agreeable to d/c home with outpt f/u.   Laray Anger, DO 11/30/13 2208

## 2013-11-27 NOTE — ED Notes (Signed)
Pt with c/o HA for couple of months per pt, states BP high couple days ago, states that she ran out of BP meds last year, also c/o hot flashes

## 2013-11-27 NOTE — ED Notes (Signed)
Patient c/o headache for several months; states has been using Sudafed for a few days and her neighbor took her BP and it was elevated.  Patient states that her neighbor told her not to go to sleep because she may die.  Patient c/o nausea.

## 2014-03-12 ENCOUNTER — Other Ambulatory Visit (HOSPITAL_COMMUNITY): Payer: Self-pay | Admitting: Nurse Practitioner

## 2014-03-12 DIAGNOSIS — Z1231 Encounter for screening mammogram for malignant neoplasm of breast: Secondary | ICD-10-CM

## 2014-03-18 ENCOUNTER — Ambulatory Visit (HOSPITAL_COMMUNITY): Payer: Medicaid Other

## 2014-03-18 IMAGING — CT CT HEAD W/O CM
1 series · 16 of 30 positions shown, 20 images · non-contrast
Comparison: None.

CLINICAL DATA: Headache.

EXAM:
CT HEAD WITHOUT CONTRAST
TECHNIQUE: Contiguous axial images were obtained from the base of the skull
through the vertex without intravenous contrast.

[Series 2: headseq 4.8 h37s · axial · 0.43mm/px · z∈[+84,+217]mm · 16 of 30 slices shown, 20 images]
[im 2/30  brain]
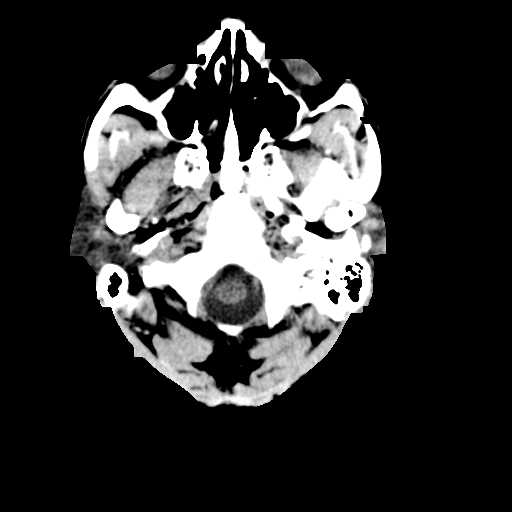
[im 2/30  bone]
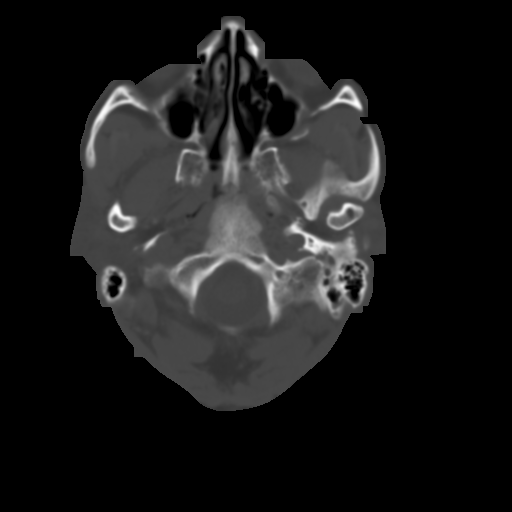
[im 4/30  brain]
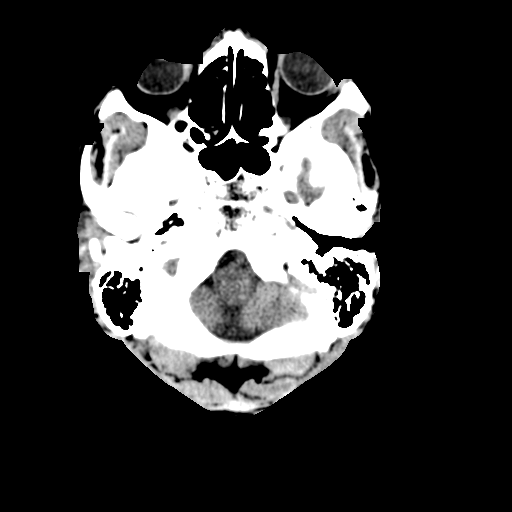
[im 6/30  brain]
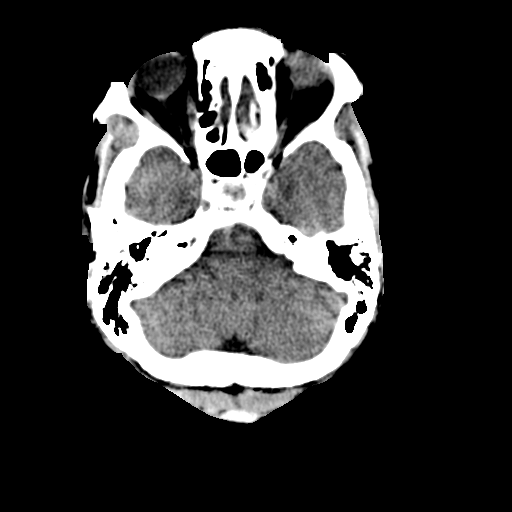
[im 8/30  brain]
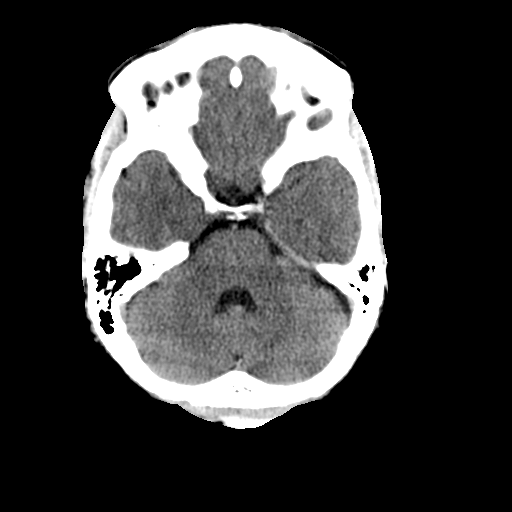
[im 9/30  brain]
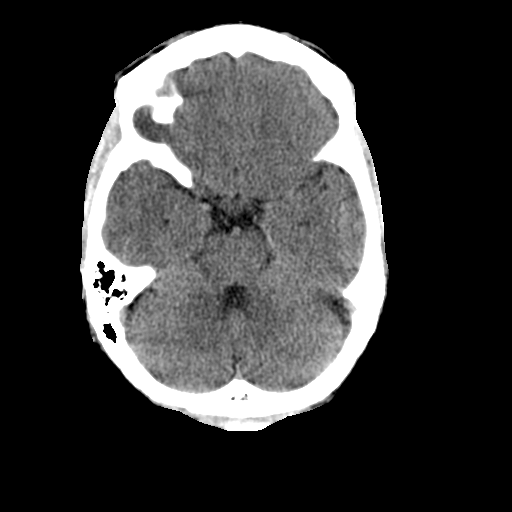
[im 9/30  bone]
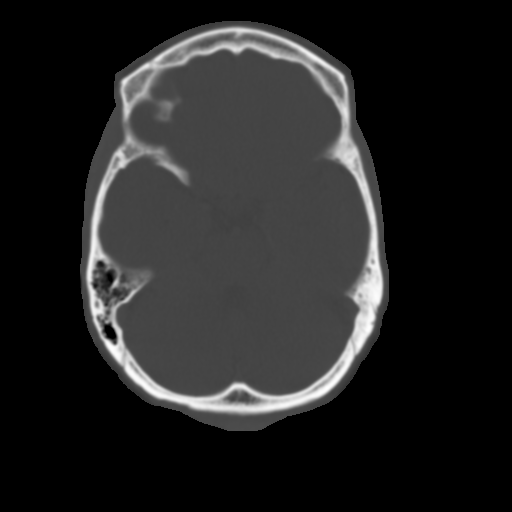
[im 11/30  brain]
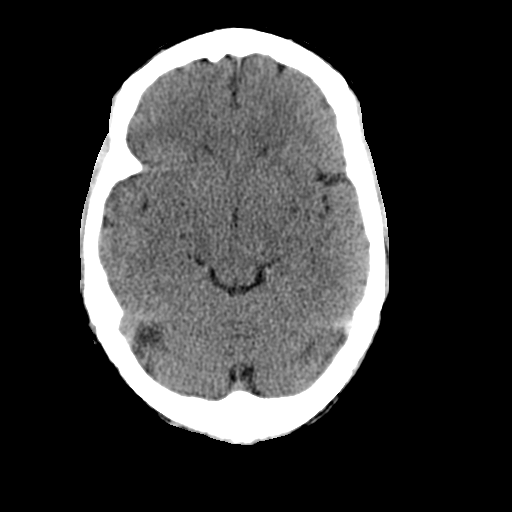
[im 13/30  brain]
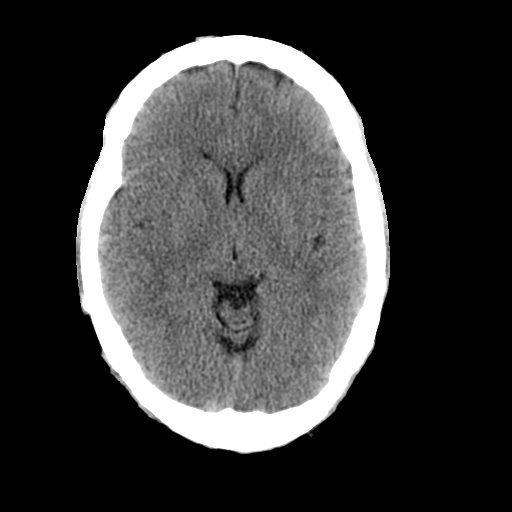
[im 15/30  brain]
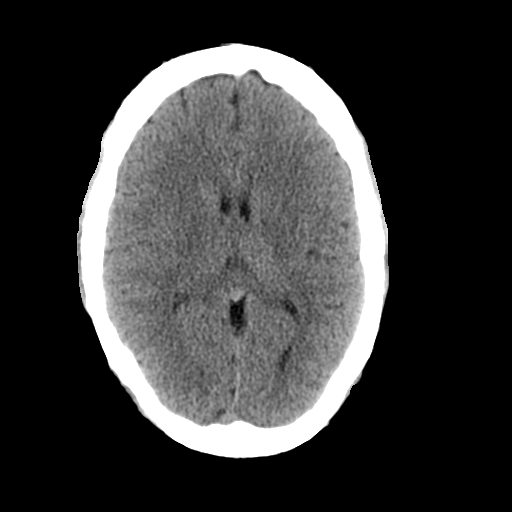
[im 16/30  brain]
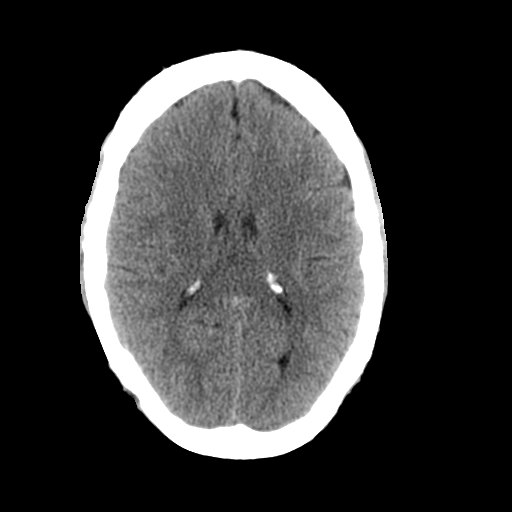
[im 16/30  bone]
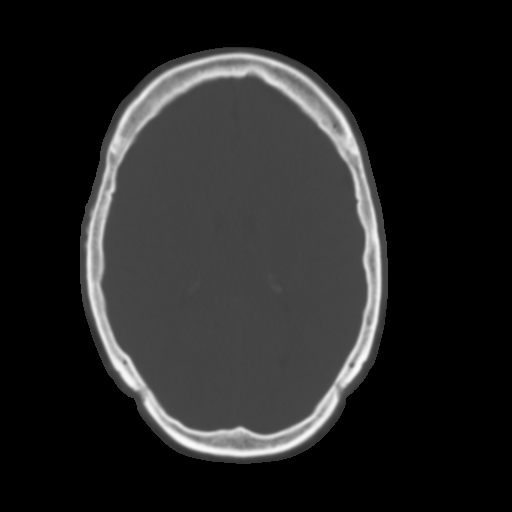
[im 18/30  brain]
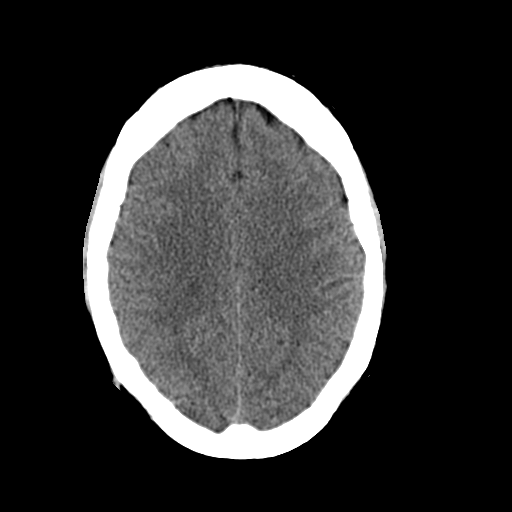
[im 20/30  brain]
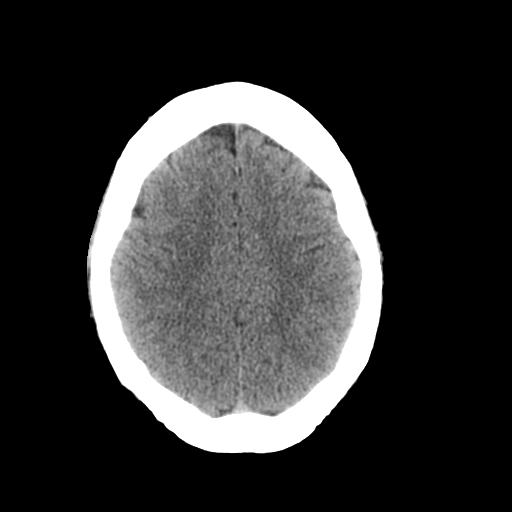
[im 22/30  brain]
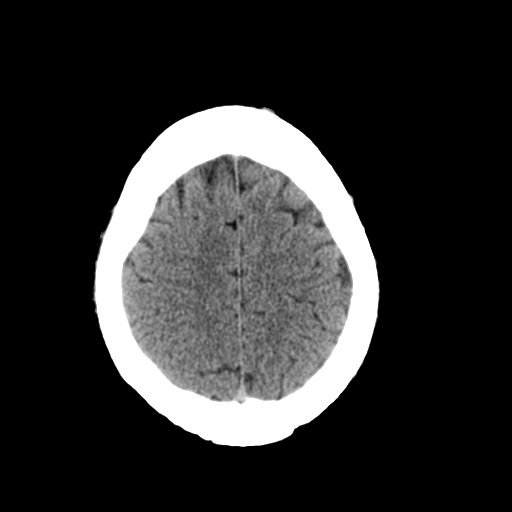
[im 23/30  brain]
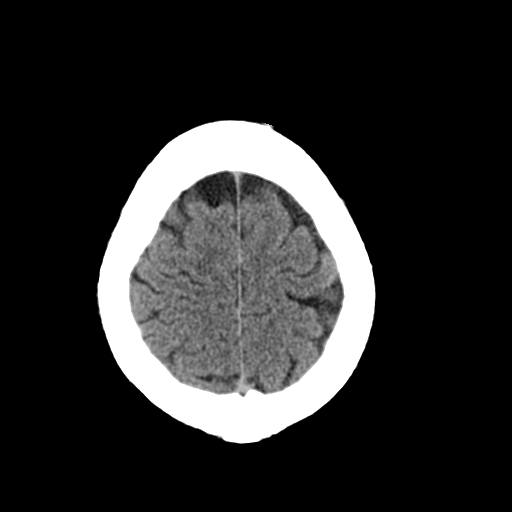
[im 23/30  bone]
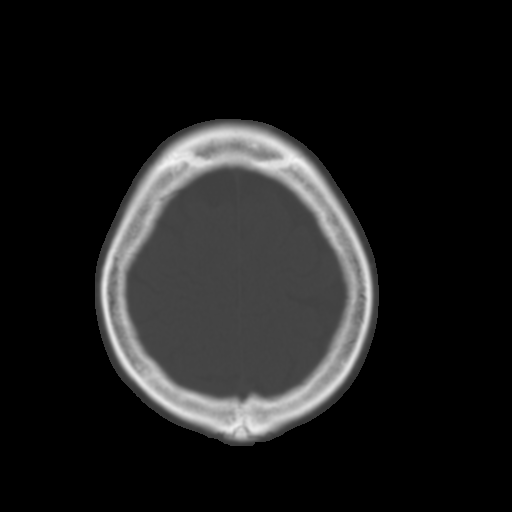
[im 25/30  brain]
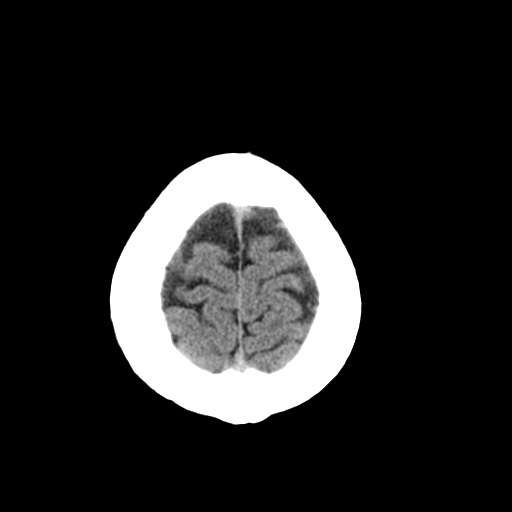
[im 27/30  brain]
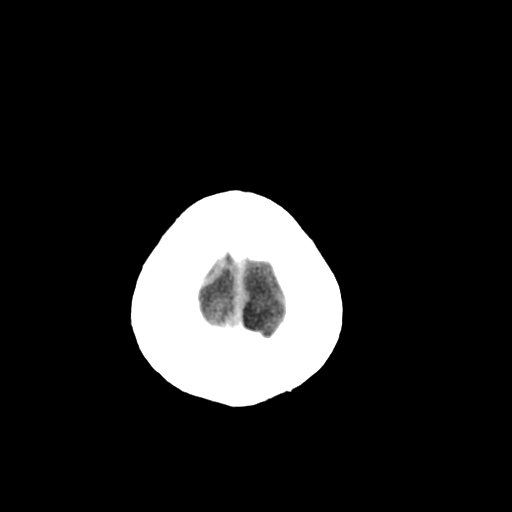
[im 29/30  brain]
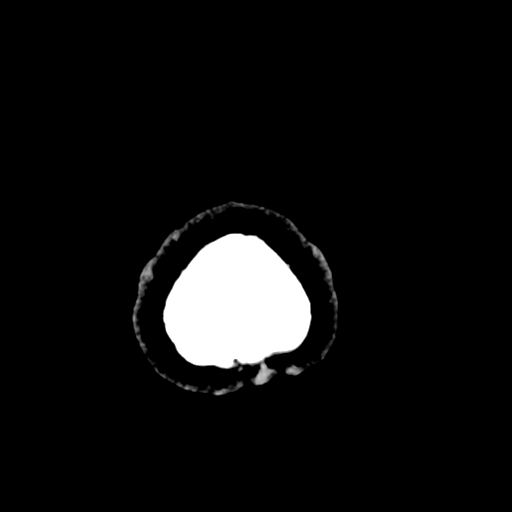

[16 of 30 positions shown; findings below may reference images not displayed]

FINDINGS: The ventricles and sulci are within normal limits for age. There is
no evidence of acute infarct, intracranial hemorrhage, mass, midline
shift, or extra-axial collection. The orbits are unremarkable. There
is partial opacification of a single posterior right ethmoid air
cell. The visualized paranasal sinuses and mastoid air cells are
otherwise clear. There is no evidence of acute fracture.
IMPRESSION: No acute intracranial abnormality.

## 2014-03-20 ENCOUNTER — Ambulatory Visit (HOSPITAL_COMMUNITY)
Admission: RE | Admit: 2014-03-20 | Discharge: 2014-03-20 | Disposition: A | Discharge status: Home / Self Care | Payer: Medicaid Other | Source: Ambulatory Visit | Attending: Nurse Practitioner | Admitting: Nurse Practitioner

## 2014-03-20 DIAGNOSIS — Z1231 Encounter for screening mammogram for malignant neoplasm of breast: Secondary | ICD-10-CM | POA: Insufficient documentation

## 2014-09-12 ENCOUNTER — Other Ambulatory Visit (HOSPITAL_COMMUNITY): Payer: Self-pay | Admitting: *Deleted

## 2014-09-12 DIAGNOSIS — R51 Headache: Principal | ICD-10-CM

## 2014-09-12 DIAGNOSIS — R519 Headache, unspecified: Secondary | ICD-10-CM

## 2014-09-17 ENCOUNTER — Ambulatory Visit (HOSPITAL_COMMUNITY): Payer: Medicaid Other | Attending: *Deleted

## 2016-07-23 LAB — BASIC METABOLIC PANEL: Glucose: 108 mg/dL

## 2017-07-17 ENCOUNTER — Emergency Department (HOSPITAL_COMMUNITY)
Admission: EM | Admit: 2017-07-17 | Discharge: 2017-07-17 | Disposition: A | Discharge status: Home / Self Care | Payer: Self-pay | Attending: Emergency Medicine | Admitting: Emergency Medicine

## 2017-07-17 ENCOUNTER — Emergency Department (HOSPITAL_COMMUNITY): Payer: Self-pay

## 2017-07-17 ENCOUNTER — Encounter (HOSPITAL_COMMUNITY): Payer: Self-pay

## 2017-07-17 DIAGNOSIS — I10 Essential (primary) hypertension: Secondary | ICD-10-CM | POA: Insufficient documentation

## 2017-07-17 DIAGNOSIS — B349 Viral infection, unspecified: Secondary | ICD-10-CM | POA: Insufficient documentation

## 2017-07-17 DIAGNOSIS — F17200 Nicotine dependence, unspecified, uncomplicated: Secondary | ICD-10-CM | POA: Insufficient documentation

## 2017-07-17 LAB — URINALYSIS, ROUTINE W REFLEX MICROSCOPIC
BACTERIA UA: NONE SEEN
BILIRUBIN URINE: NEGATIVE
Glucose, UA: NEGATIVE mg/dL
Ketones, ur: NEGATIVE mg/dL
LEUKOCYTES UA: NEGATIVE
Nitrite: NEGATIVE
Protein, ur: NEGATIVE mg/dL
Specific Gravity, Urine: 1.011 (ref 1.005–1.030)
pH: 8 (ref 5.0–8.0)

## 2017-07-17 LAB — COMPREHENSIVE METABOLIC PANEL
ALK PHOS: 67 U/L (ref 38–126)
ALT: 11 U/L — ABNORMAL LOW (ref 14–54)
AST: 17 U/L (ref 15–41)
Albumin: 4.3 g/dL (ref 3.5–5.0)
Anion gap: 8 (ref 5–15)
BUN: 6 mg/dL (ref 6–20)
CALCIUM: 9 mg/dL (ref 8.9–10.3)
CO2: 26 mmol/L (ref 22–32)
Chloride: 106 mmol/L (ref 101–111)
Creatinine, Ser: 0.86 mg/dL (ref 0.44–1.00)
GFR calc non Af Amer: >60 mL/min (ref >60)
Glucose, Bld: 108 mg/dL — ABNORMAL HIGH (ref 65–99)
Potassium: 3.5 mmol/L (ref 3.5–5.1)
Sodium: 140 mmol/L (ref 135–145)
TOTAL PROTEIN: 7.8 g/dL (ref 6.5–8.1)
Total Bilirubin: 0.7 mg/dL (ref 0.3–1.2)

## 2017-07-17 LAB — CBC WITH DIFFERENTIAL/PLATELET
Basophils Absolute: 0 10*3/uL (ref 0.0–0.1)
Basophils Relative: 0 %
Eosinophils Absolute: 0 10*3/uL (ref 0.0–0.7)
Eosinophils Relative: 0 %
HEMATOCRIT: 40.8 % (ref 36.0–46.0)
Hemoglobin: 13.1 g/dL (ref 12.0–15.0)
Lymphocytes Relative: 13 %
Lymphs Abs: 0.8 10*3/uL (ref 0.7–4.0)
MCH: 24.6 pg — ABNORMAL LOW (ref 26.0–34.0)
MCHC: 32.1 g/dL (ref 30.0–36.0)
MCV: 76.7 fL — ABNORMAL LOW (ref 78.0–100.0)
Monocytes Absolute: 0.6 10*3/uL (ref 0.1–1.0)
Monocytes Relative: 9 %
Neutro Abs: 5.3 10*3/uL (ref 1.7–7.7)
Neutrophils Relative %: 78 %
Platelets: 152 10*3/uL (ref 150–400)
RBC: 5.32 MIL/uL — ABNORMAL HIGH (ref 3.87–5.11)
RDW: 16.1 % — ABNORMAL HIGH (ref 11.5–15.5)
WBC: 6.7 10*3/uL (ref 4.0–10.5)

## 2017-07-17 LAB — CK: CK TOTAL: 83 U/L (ref 38–234)

## 2017-07-17 MED ORDER — AMLODIPINE BESYLATE 5 MG PO TABS
5.0000 mg | ORAL_TABLET | Freq: Once | ORAL | Status: AC
  Administered 2017-07-17: 5 mg via ORAL
  Filled 2017-07-17: qty 1

## 2017-07-17 MED ORDER — SODIUM CHLORIDE 0.9 % IV BOLUS (SEPSIS)
1000.0000 mL | Freq: Once | INTRAVENOUS | Status: AC
  Administered 2017-07-17: 1000 mL via INTRAVENOUS

## 2017-07-17 MED ORDER — AMLODIPINE BESYLATE 5 MG PO TABS: 5.0000 mg | ORAL_TABLET | Freq: Every day | ORAL | 1 refills | Status: DC

## 2017-07-17 MED ORDER — IBUPROFEN 400 MG PO TABS
600.0000 mg | ORAL_TABLET | Freq: Once | ORAL | Status: AC
  Administered 2017-07-17: 600 mg via ORAL
  Filled 2017-07-17: qty 2

## 2017-07-17 NOTE — ED Provider Notes (Signed)
Emergency Department Provider Note   I have reviewed the triage vital signs and the nursing notes.   HISTORY  Chief Complaint Generalized Body Aches   HPI Belinda Cunningham is a 56 y.o. female with PMH of HTN and depression presents to the ED with diffuse body aches, cough, and HA. Symptoms have been ongoing for the past 2 days. She reports contact with a sick grandchild recently. Denies any fevers or chills. She's had some associated loss of appetite but denies nausea, vomiting, diarrhea. No abdominal pain. Patient reports taking blood pressure medication the past but has been off of it since 2016 when she ran out of medication. No other new medications started recently. No radiation of symptoms. She is not having more muscle cramping in one place over another. Headache is been moderate and gradually worsening. No vision changes.    Past Medical History:  Diagnosis Date  . Alopecia   . Cigarette smoker   . Depression   . History of echocardiogram 04/2012   normal EF, no regional wall motion abnormalities  . Hypertension   . Non compliance w medication regimen     Patient Active Problem List   Diagnosis Date Noted  . SEBACEOUS CYST, INFECTED 09/16/2010  . FATIGUE 01/08/2010  . ALLERGY UNSPECIFIED NOT ELSEWHERE CLASSIFIED 09/22/2008  . DEPRESSION 04/02/2008  . HYPERTENSION 04/02/2008  . ALOPECIA 04/02/2008    History reviewed. No pertinent surgical history.  Current Outpatient Rx  . Order #: [id number] Class: Print    Allergies Patient has no known allergies.  Family History  Problem Relation Age of Onset  . Diabetes Mother        DJD    Social History Social History  Substance Use Topics  . Smoking status: Current Every Day Smoker  . Smokeless tobacco: Never Used  . Alcohol use No    Review of Systems  Constitutional: No fever/chills Eyes: No visual changes. ENT: No sore throat. Cardiovascular: Denies chest pain. Respiratory: Denies shortness of  breath. Positive cough.  Gastrointestinal: No abdominal pain.  No nausea, no vomiting.  No diarrhea.  No constipation. Genitourinary: Negative for dysuria. Musculoskeletal: Negative for back pain. Positive muscle aches.  Skin: Negative for rash. Neurological: Negative for focal weakness or numbness. Positive moderate HA.   10-point ROS otherwise negative.  ____________________________________________   PHYSICAL EXAM:  VITAL SIGNS: ED Triage Vitals  Enc Vitals Group     BP 07/17/17 1147 (S) (!) 187/104     Pulse Rate 07/17/17 1147 100     Resp 07/17/17 1147 16     Temp 07/17/17 1147 98.4 F (36.9 C)     Temp Source 07/17/17 1147 Oral     SpO2 07/17/17 1147 99 %     Weight 07/17/17 1145 140 lb (63.5 kg)     Height 07/17/17 1145 5\' 4"  (1.626 m)     Pain Score 07/17/17 1145 9   Constitutional: Alert and oriented. Well appearing and in no acute distress. Eyes: Conjunctivae are normal. Head: Atraumatic. Nose: No congestion/rhinnorhea. Mouth/Throat: Mucous membranes are slightly dry.  Neck: No stridor.  No meningeal signs.  Cardiovascular: Normal rate, regular rhythm. Good peripheral circulation. Grossly normal heart sounds.   Respiratory: Normal respiratory effort.  No retractions. Lungs CTAB. Gastrointestinal: Soft and nontender. No distention.  Musculoskeletal: No lower extremity tenderness nor edema. No gross deformities of extremities. Neurologic:  Normal speech and language. No gross focal neurologic deficits are appreciated.  Skin:  Skin is warm, dry and intact. No  rash noted.  ____________________________________________   LABS (all labs ordered are listed, but only abnormal results are displayed)  Labs Reviewed  COMPREHENSIVE METABOLIC PANEL - Abnormal; Notable for the following:       Result Value   Glucose, Bld 108 (*)    ALT 11 (*)    All other components within normal limits  CBC WITH DIFFERENTIAL/PLATELET - Abnormal; Notable for the following:    RBC 5.32  (*)    MCV 76.7 (*)    MCH 24.6 (*)    RDW 16.1 (*)    All other components within normal limits  URINALYSIS, ROUTINE W REFLEX MICROSCOPIC - Abnormal; Notable for the following:    Hgb urine dipstick SMALL (*)    Squamous Epithelial / LPF 0-5 (*)    All other components within normal limits  URINE CULTURE  CK   ____________________________________________  RADIOLOGY  Dg Chest 2 View  Result Date: 07/17/2017 CLINICAL DATA:  Cough EXAM: CHEST  2 VIEW COMPARISON:  None. FINDINGS: The lungs are clear. The heart size and pulmonary vascularity are normal. No adenopathy. There is midthoracic levoscoliosis. IMPRESSION: No edema or consolidation. Electronically Signed   By: Bretta Bang III M.D.   On: 07/17/2017 13:05    ____________________________________________   PROCEDURES  Procedure(s) performed:   Procedures  None ____________________________________________   INITIAL IMPRESSION / ASSESSMENT AND PLAN / ED COURSE  Pertinent labs & imaging results that were available during my care of the patient were reviewed by me and considered in my medical decision making (see chart for details).  Patient resents to the emergency department with cough and generalized muscle aches. She's had some associated fatigue and decreased appetite. No focal findings on exam. Normal vital signs the exception of very high blood pressure. Patient has been off of her amlodipine for the past 2 years. Will give dose here and plan to discharge home with medication refill. No indication by history or exam to suggest hypertensive emergency. Plan for IV fluids and lab work. Will follow chest x-ray.  01:45 PM Patient feeling better after IVF. Suspect viral infection with body aches and fatigue. No anemia or renal failure. No evidence of HTN emergency. Discussed the importance of HTN follow up and medication compliance. Has seen the health department in the past. Will discharge with contact information for  [employer] as well.  At this time, I do not feel there is any life-threatening condition present. I have reviewed and discussed all results (EKG, imaging, lab, urine as appropriate), exam findings with patient. I have reviewed nursing notes and appropriate previous records.  I feel the patient is safe to be discharged home without further emergent workup. Discussed usual and customary return precautions. Patient and family (if present) verbalize understanding and are comfortable with this plan.  Patient will follow-up with their primary care provider. If they do not have a primary care provider, information for follow-up has been provided to them. All questions have been answered.  ____________________________________________  FINAL CLINICAL IMPRESSION(S) / ED DIAGNOSES  Final diagnoses:  Viral syndrome  Essential hypertension     MEDICATIONS GIVEN DURING THIS VISIT:  Medications  sodium chloride 0.9 % bolus 1,000 mL (0 mLs Intravenous Stopped 07/17/17 1405)  amLODipine (NORVASC) tablet 5 mg (5 mg Oral Given 07/17/17 1229)  ibuprofen (ADVIL,MOTRIN) tablet 600 mg (600 mg Oral Given 07/17/17 1229)     NEW OUTPATIENT MEDICATIONS STARTED DURING THIS VISIT:  Discharge Medication List as of 07/17/2017  1:48 PM  START taking these medications   Details  amLODipine (NORVASC) 5 MG tablet Take 1 tablet (5 mg total) by mouth daily., Starting Mon 07/17/2017, Until Fri 09/15/2017, Print          Note:  This document was prepared using Dragon voice recognition software and may include unintentional dictation errors.  Alona BeneJoshua Staci Dack, MD Emergency Medicine   Anab Vivar, Arlyss RepressJoshua G, MD 07/17/17 [phone]

## 2017-07-17 NOTE — ED Triage Notes (Signed)
Reports of body aches, loss of appetite and headache. Has not been taking BP medications.

## 2017-07-17 NOTE — Discharge Instructions (Signed)

## 2017-07-19 LAB — URINE CULTURE: SPECIAL REQUESTS: NORMAL

## 2017-11-05 IMAGING — DX DG CHEST 2V
2 series · 2 of 2 positions shown · non-contrast
Comparison: None.

CLINICAL DATA: Cough

EXAM:
CHEST  2 VIEW

[chest pa]
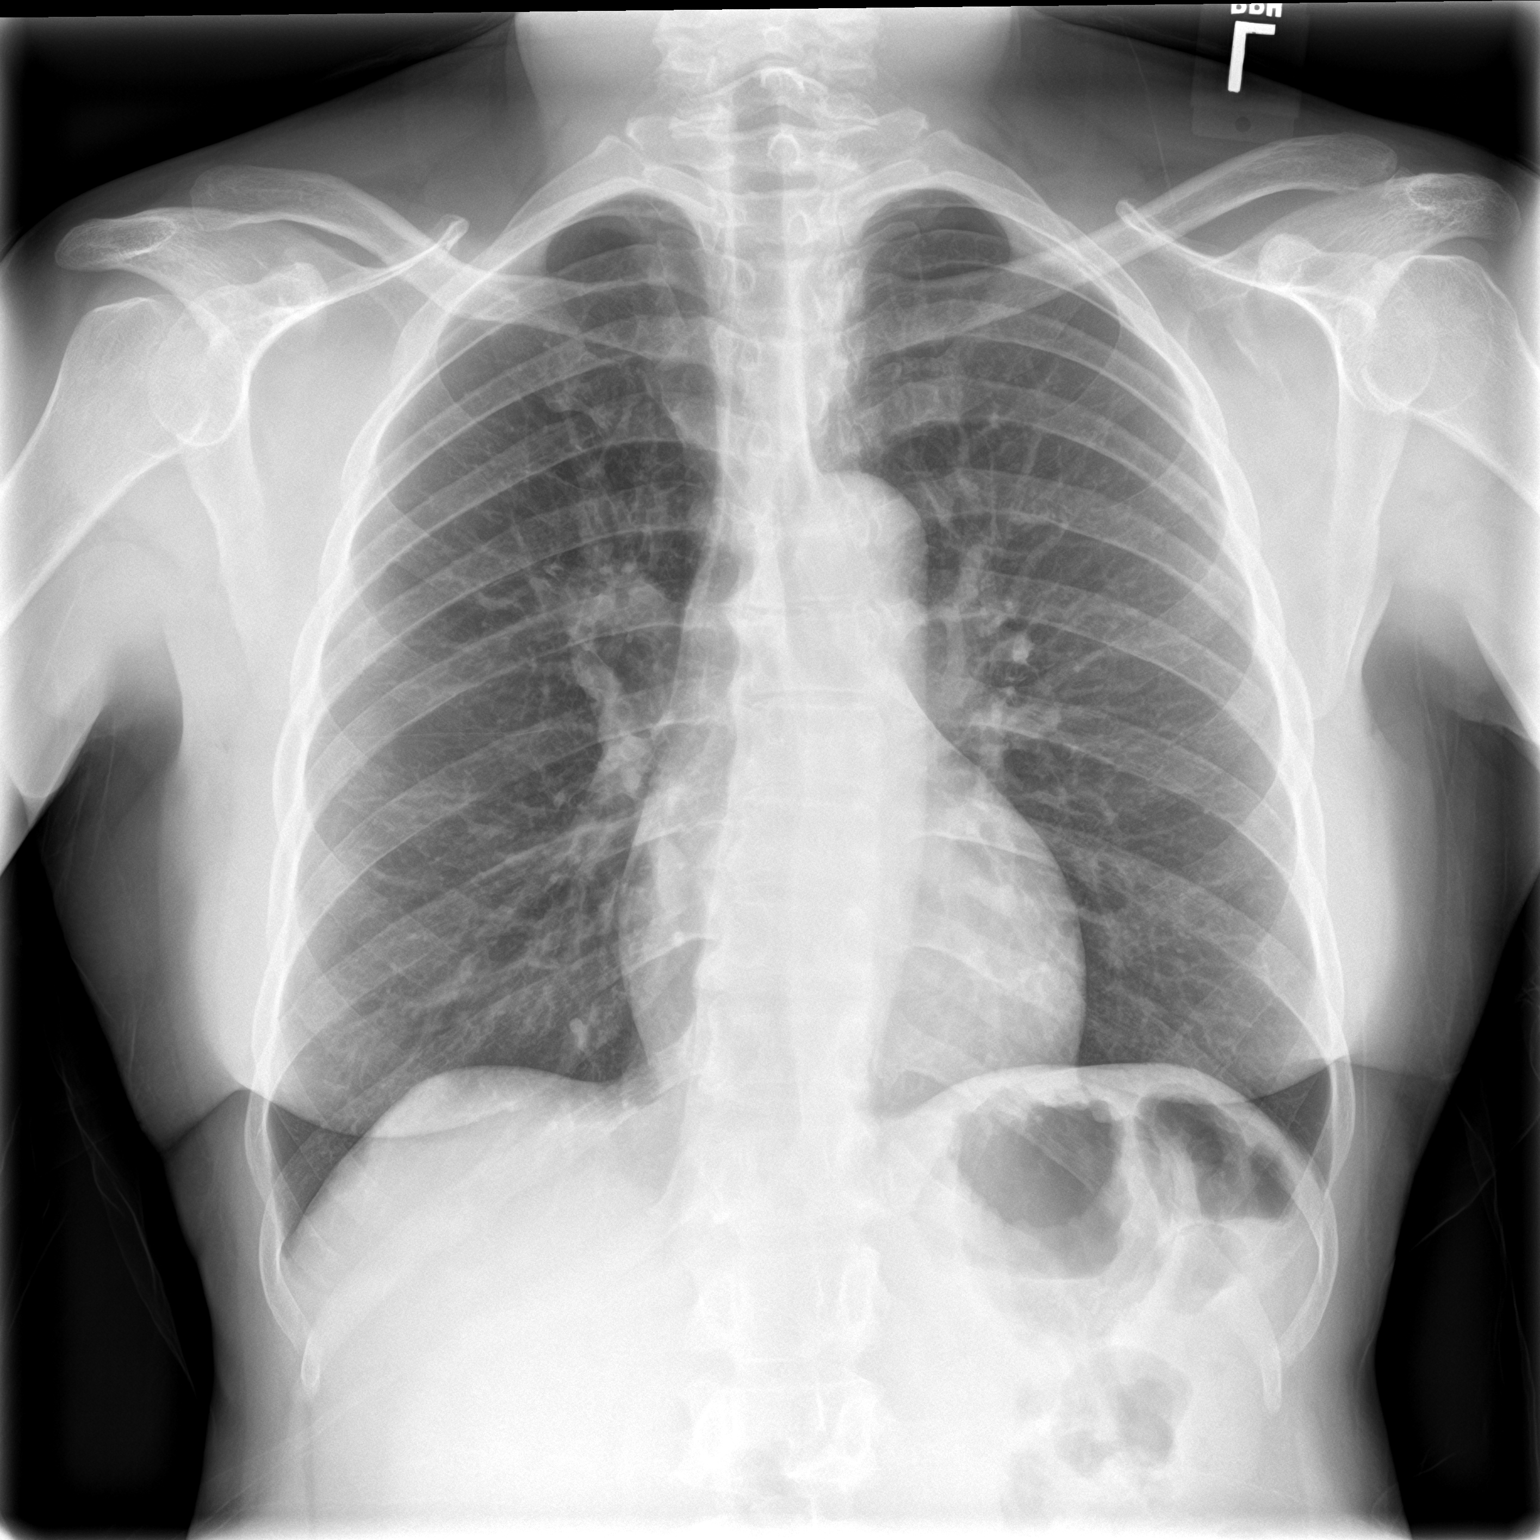

[chest lat]
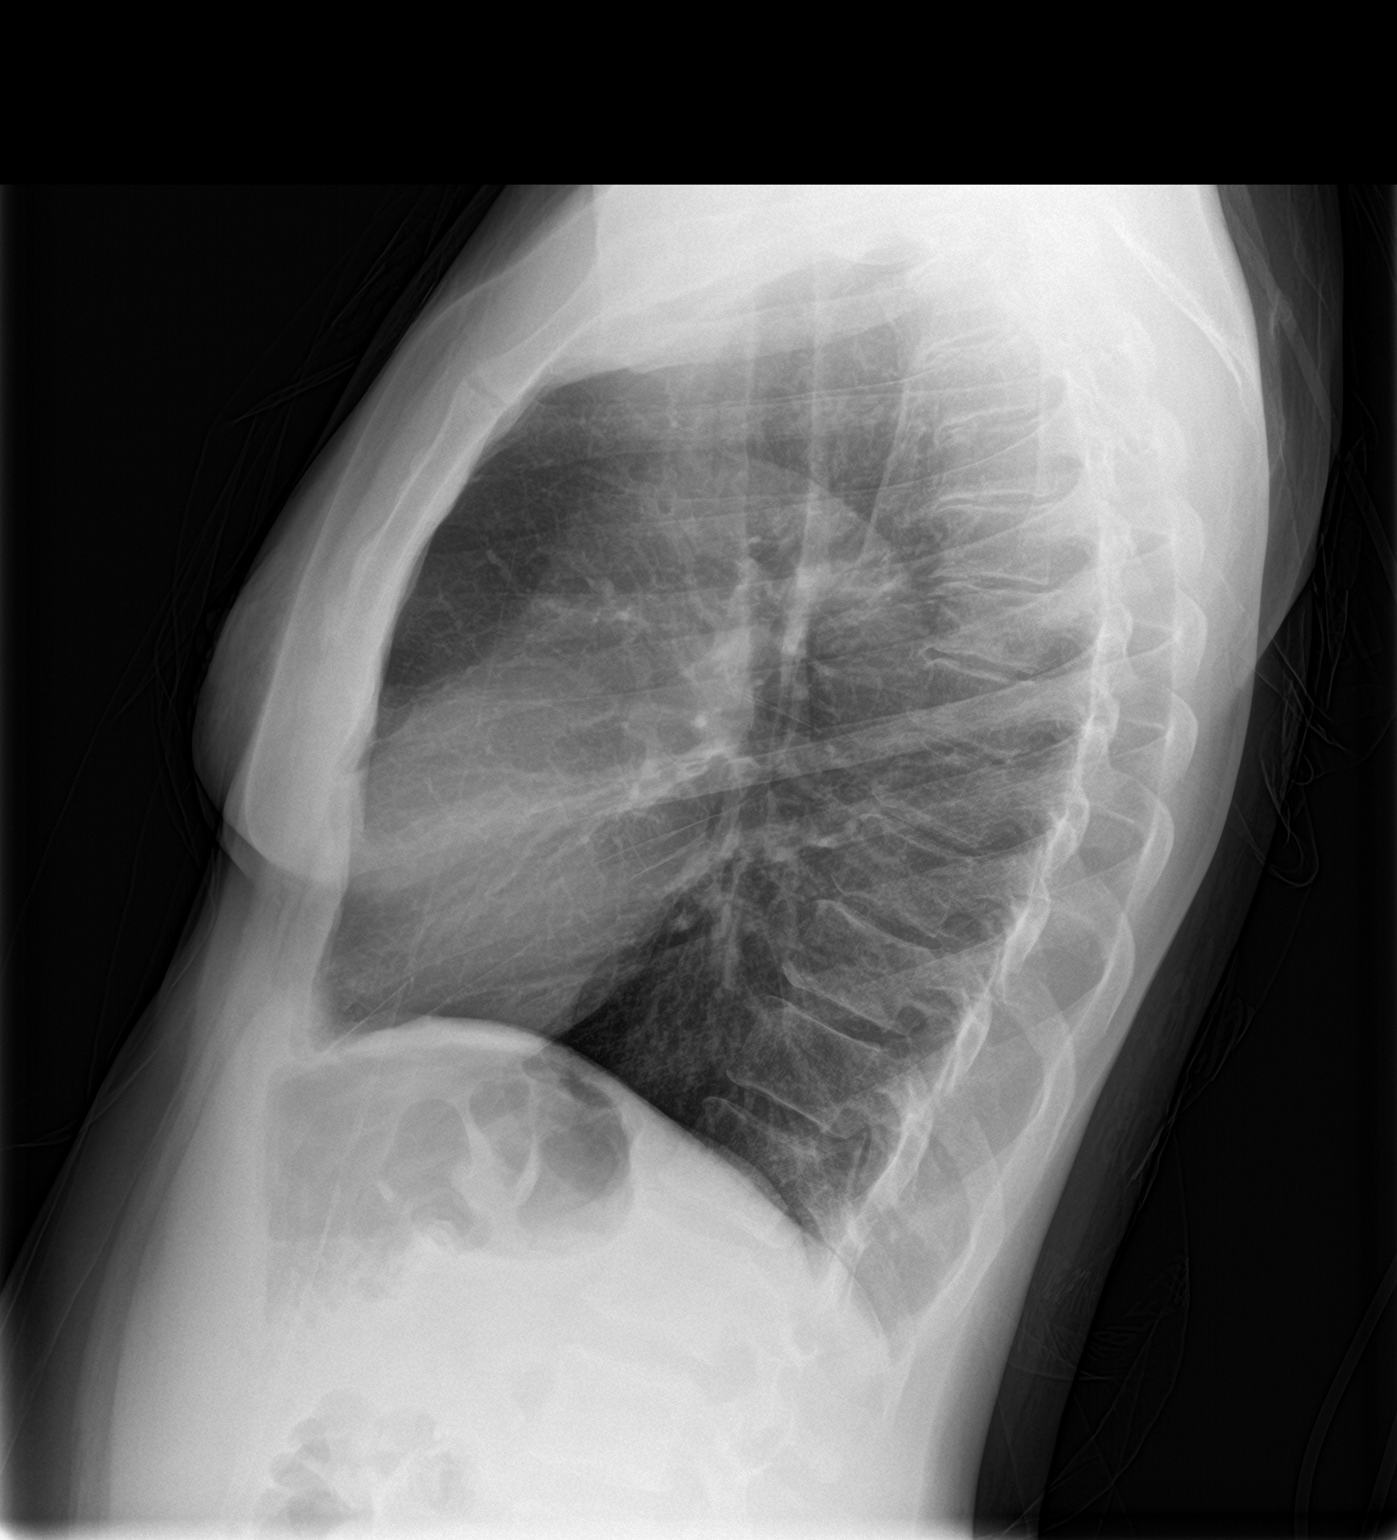

[2 of 2 positions shown; findings below may reference images not displayed]

FINDINGS: The lungs are clear. The heart size and pulmonary vascularity are
normal. No adenopathy. There is midthoracic levoscoliosis.
IMPRESSION: No edema or consolidation.

## 2018-12-13 ENCOUNTER — Encounter (HOSPITAL_COMMUNITY): Payer: Self-pay | Admitting: *Deleted

## 2018-12-13 ENCOUNTER — Other Ambulatory Visit: Payer: Self-pay

## 2018-12-13 ENCOUNTER — Emergency Department (HOSPITAL_COMMUNITY)
Admission: EM | Admit: 2018-12-13 | Discharge: 2018-12-13 | Disposition: A | Discharge status: Home / Self Care | Payer: Self-pay | Attending: Emergency Medicine | Admitting: Emergency Medicine

## 2018-12-13 DIAGNOSIS — Z87891 Personal history of nicotine dependence: Secondary | ICD-10-CM | POA: Insufficient documentation

## 2018-12-13 DIAGNOSIS — I10 Essential (primary) hypertension: Secondary | ICD-10-CM | POA: Insufficient documentation

## 2018-12-13 DIAGNOSIS — I517 Cardiomegaly: Secondary | ICD-10-CM | POA: Insufficient documentation

## 2018-12-13 LAB — COMPREHENSIVE METABOLIC PANEL
ALK PHOS: 61 U/L (ref 38–126)
ALT: 7 U/L (ref 0–44)
ANION GAP: 9 (ref 5–15)
AST: 17 U/L (ref 15–41)
Albumin: 4.8 g/dL (ref 3.5–5.0)
BUN: 10 mg/dL (ref 6–20)
CALCIUM: 9.3 mg/dL (ref 8.9–10.3)
CO2: 24 mmol/L (ref 22–32)
Chloride: 106 mmol/L (ref 98–111)
Creatinine, Ser: 0.85 mg/dL (ref 0.44–1.00)
Glucose, Bld: 101 mg/dL — ABNORMAL HIGH (ref 70–99)
Potassium: 3.5 mmol/L (ref 3.5–5.1)
SODIUM: 139 mmol/L (ref 135–145)
Total Bilirubin: 0.9 mg/dL (ref 0.3–1.2)
Total Protein: 8.7 g/dL — ABNORMAL HIGH (ref 6.5–8.1)

## 2018-12-13 LAB — CBC
HCT: 43.2 % (ref 36.0–46.0)
Hemoglobin: 13.1 g/dL (ref 12.0–15.0)
MCH: 23.7 pg — AB (ref 26.0–34.0)
MCHC: 30.3 g/dL (ref 30.0–36.0)
MCV: 78.3 fL — ABNORMAL LOW (ref 80.0–100.0)
Platelets: 208 10*3/uL (ref 150–400)
RBC: 5.52 MIL/uL — AB (ref 3.87–5.11)
RDW: 15.8 % — ABNORMAL HIGH (ref 11.5–15.5)
WBC: 4.5 10*3/uL (ref 4.0–10.5)
nRBC: 0 % (ref 0.0–0.2)

## 2018-12-13 MED ORDER — METOPROLOL TARTRATE 50 MG PO TABS
50.0000 mg | ORAL_TABLET | Freq: Once | ORAL | Status: AC
  Administered 2018-12-13: 50 mg via ORAL
  Filled 2018-12-13: qty 1

## 2018-12-13 MED ORDER — LISINOPRIL 10 MG PO TABS: 10.0000 mg | ORAL_TABLET | Freq: Every day | ORAL | 1 refills | Status: AC

## 2018-12-13 MED ORDER — LISINOPRIL 10 MG PO TABS
10.0000 mg | ORAL_TABLET | Freq: Every day | ORAL | Status: AC
  Administered 2018-12-13: 10 mg via ORAL
  Filled 2018-12-13: qty 1

## 2018-12-13 MED ORDER — METOPROLOL TARTRATE 50 MG PO TABS: 25.0000 mg | ORAL_TABLET | Freq: Two times a day (BID) | ORAL | 1 refills | Status: AC

## 2018-12-13 NOTE — ED Triage Notes (Signed)
Stopped taking her blood pressure medication , states she was sent her by the health department for evaluation

## 2018-12-13 NOTE — ED Provider Notes (Signed)
EMERGENCY DEPARTMENT Provider Note   CSN: [id number] Arrival date & time: 12/13/18  1445     History   Chief Complaint Chief Complaint  Patient presents with  . Hypertension    HPI Belinda Cunningham is a 58 y.o. female.  HPI  58 y/o female - hx of Htn - states that she has not had any issues with symptoms, she has not taken her medications in over a year, she reports that she does not take them because of financial issues.  She denies having any chest pain shortness of breath changes in vision weakness numbness headaches lightheadedness or any other symptoms.  [facility] because of ongoing high blood pressure which was measured at over 220 systolic albeit the patient is asymptomatic.  She was there just for routine yearly maintenance physical.  Past Medical History:  Diagnosis Date  . Alopecia   . Cigarette smoker   . Depression   . History of echocardiogram 04/2012   normal EF, no regional wall motion abnormalities  . Hypertension   . Non compliance w medication regimen     Patient Active Problem List   Diagnosis Date Noted  . SEBACEOUS CYST, INFECTED 09/16/2010  . FATIGUE 01/08/2010  . ALLERGY UNSPECIFIED NOT ELSEWHERE CLASSIFIED 09/22/2008  . DEPRESSION 04/02/2008  . HYPERTENSION 04/02/2008  . ALOPECIA 04/02/2008    Past Surgical History:  Procedure Laterality Date  . CESAREAN SECTION    . CHOLECYSTECTOMY       OB History   No obstetric history on file.      Home Medications    Prior to Admission medications   Medication Sig Start Date End Date Taking? Authorizing Provider  lisinopril (PRINIVIL,ZESTRIL) 10 MG tablet Take 1 tablet (10 mg total) by mouth daily. 12/13/18   Eber HongMiller, Elton Catalano, MD  metoprolol tartrate (LOPRESSOR) 50 MG tablet Take 0.5 tablets (25 mg total) by mouth 2 (two) times daily. 12/13/18   Eber HongMiller, Redell Bhandari, MD    Family History Family History  Problem Relation Age of Onset  . Diabetes  Mother        DJD    Social History Social History   Tobacco Use  . Smoking status: Former Games developermoker  . Smokeless tobacco: Never Used  Substance Use Topics  . Alcohol use: Yes    Comment: occassional  . Drug use: Yes    Types: Marijuana     Allergies   Patient has no known allergies.   Review of Systems Review of Systems  All other systems reviewed and are negative.    Physical Exam Updated Vital Signs BP (!) 228/99   Pulse 78   Temp 98.6 F (37 C)   Resp 20   Ht 1.676 m (5\' 6" )   Wt 67.6 kg   LMP  (LMP Unknown)   SpO2 100%   BMI 24.05 kg/m   Physical Exam Vitals signs and nursing note reviewed.  Constitutional:      General: She is not in acute distress.    Appearance: She is well-developed.  HENT:     Head: Normocephalic and atraumatic.     Mouth/Throat:     Pharynx: No oropharyngeal exudate.  Eyes:     General: No scleral icterus.       Right eye: No discharge.        Left eye: No discharge.     Conjunctiva/sclera: Conjunctivae normal.     Pupils: Pupils are equal, round, and  reactive to light.  Neck:     Musculoskeletal: Normal range of motion and neck supple.     Thyroid: No thyromegaly.     Vascular: No JVD.  Cardiovascular:     Rate and Rhythm: Normal rate and regular rhythm.     Heart sounds: Normal heart sounds. No murmur. No friction rub. No gallop.   Pulmonary:     Effort: Pulmonary effort is normal. No respiratory distress.     Breath sounds: Normal breath sounds. No wheezing or rales.  Abdominal:     General: Bowel sounds are normal. There is no distension.     Palpations: Abdomen is soft. There is no mass.     Tenderness: There is no abdominal tenderness.  Musculoskeletal: Normal range of motion.        General: No tenderness.  Lymphadenopathy:     Cervical: No cervical adenopathy.  Skin:    General: Skin is warm and dry.     Findings: No erythema or rash.  Neurological:     Mental Status: She is alert.     Coordination:  Coordination normal.     Comments: Neurologic exam:  Speech clear, pupils equal round reactive to light, extraocular movements intact  Normal peripheral visual fields Cranial nerves III through XII normal including no facial droop Follows commands, moves all extremities x4, normal strength to bilateral upper and lower extremities at all major muscle groups including grip Sensation normal to light touch and pinprick Coordination intact, no limb ataxia, finger-nose-finger normal, heel shin normal bilaterally Rapid alternating movements normal No pronator drift Gait normal Can heal and toe walk without weakness.   Psychiatric:        Behavior: Behavior normal.      ED Treatments / Results  Labs (all labs ordered are listed, but only abnormal results are displayed) Labs Reviewed  CBC - Abnormal; Notable for the following components:      Result Value   RBC 5.52 (*)    MCV 78.3 (*)    MCH 23.7 (*)    RDW 15.8 (*)    All other components within normal limits  COMPREHENSIVE METABOLIC PANEL - Abnormal; Notable for the following components:   Glucose, Bld 101 (*)    Total Protein 8.7 (*)    All other components within normal limits    EKG EKG Interpretation  Date/Time:  Thursday December 13 2018 16:46:02 EST Ventricular Rate:  64 PR Interval:    QRS Duration: 95 QT Interval:  409 QTC Calculation: 422 R Axis:   44 Text Interpretation:  Sinus rhythm Prominent P waves, nondiagnostic Probable LVH with secondary repol abnrm No old tracing to compare Abnormal ekg Confirmed by Eber Hong ([id number]) on 12/13/2018 5:15:44 PM   Radiology No results found.  Procedures Procedures (including critical care time)  Medications Ordered in ED Medications  lisinopril (PRINIVIL,ZESTRIL) tablet 10 mg (has no administration in time range)  metoprolol tartrate (LOPRESSOR) tablet 50 mg (50 mg Oral Given 12/13/18 1711)     Initial Impression / Assessment and Plan / ED Course  I have reviewed  the triage vital signs and the nursing notes.  Pertinent labs & imaging results that were available during my care of the patient were reviewed by me and considered in my medical decision making (see chart for details).  Clinical Course as of Dec 13 1826  Morey Hummingbird Dec 13, 2018  1715 I have personally seen and interpreted the EKG which appears to be consistent with left ventricular hypertrophy.  There is no old EKG to compare this to.  The ST and T wave abnormalities are repolarization in nature.   [BM]  1813 CMP is normal - CBC unremarkable - EKG with findings of LVH - she will be started on an ACEi and BB and will need close f/u - she is in agreement - I had a discussion with the pt about the risk of ACEi including Angioedema and she was agreeable to the risk.   [BM]    Clinical Course User Index [BM] Eber HongMiller, Maresha Anastos, MD    The patient is well-appearing, she is very hypertensive and will need to have some medication to start the process of bringing this down however she is totally asymptomatic and likely has been severely hypertensive for quite some time.  We will check EKG and renal function, she will be started on cost-effective medications and encouraged to follow-up closely.  She is agreeable to the plan  Recheck blood pressure on my exam was 206/89, the patient is improved, she has been given her first dose of medication, her metabolic panel was unremarkable and she had a full discussion regarding the risks of the medications and is agreeable to start them.  Stable for discharge  Final Clinical Impressions(s) / ED Diagnoses   Final diagnoses:  Essential hypertension  LVH (left ventricular hypertrophy)    ED Discharge Orders         Ordered    lisinopril (PRINIVIL,ZESTRIL) 10 MG tablet  Daily     12/13/18 1826    metoprolol tartrate (LOPRESSOR) 50 MG tablet  2 times daily     12/13/18 1826           Eber HongMiller, Maddax Palinkas, MD 12/13/18 [phone]

## 2018-12-13 NOTE — Discharge Instructions (Addendum)
Your blood pressure has been elevated today.  Your blood work is normal, your EKG shows that you have a enlarged heart which occurs when you have high blood pressure. Please start taking the following medications immediately, I have given you your nighttime dose, next dose should be tomorrow morning Lisinopril 10 mg daily Metoprolol, 25 mg by mouth twice a day These medications are both very inexpensive, please be aware that the lisinopril may cause some swelling of the lips or tongue, this may occur at any time even years after starting the medication.  If that occurs please return to the emergency department immediately and stop taking the medication.

## 2022-06-22 ENCOUNTER — Telehealth: Payer: Self-pay | Admitting: *Deleted

## 2022-09-01 DIAGNOSIS — R739 Hyperglycemia, unspecified: Secondary | ICD-10-CM | POA: Diagnosis not present

## 2022-09-01 DIAGNOSIS — Z0001 Encounter for general adult medical examination with abnormal findings: Secondary | ICD-10-CM | POA: Diagnosis not present

## 2022-09-01 DIAGNOSIS — E559 Vitamin D deficiency, unspecified: Secondary | ICD-10-CM | POA: Diagnosis not present

## 2022-09-12 ENCOUNTER — Other Ambulatory Visit: Payer: Self-pay

## 2022-09-12 ENCOUNTER — Emergency Department (HOSPITAL_COMMUNITY)
Admission: EM | Admit: 2022-09-12 | Discharge: 2022-09-12 | Disposition: A | Discharge status: Home / Self Care | Payer: Medicaid Other | Attending: Emergency Medicine | Admitting: Emergency Medicine

## 2022-09-12 ENCOUNTER — Encounter (HOSPITAL_COMMUNITY): Payer: Self-pay | Admitting: Emergency Medicine

## 2022-09-12 DIAGNOSIS — R519 Headache, unspecified: Secondary | ICD-10-CM | POA: Insufficient documentation

## 2022-09-12 DIAGNOSIS — Z79899 Other long term (current) drug therapy: Secondary | ICD-10-CM | POA: Insufficient documentation

## 2022-09-12 DIAGNOSIS — R109 Unspecified abdominal pain: Secondary | ICD-10-CM | POA: Diagnosis present

## 2022-09-12 DIAGNOSIS — R1084 Generalized abdominal pain: Secondary | ICD-10-CM | POA: Diagnosis not present

## 2022-09-12 DIAGNOSIS — R42 Dizziness and giddiness: Secondary | ICD-10-CM | POA: Insufficient documentation

## 2022-09-12 DIAGNOSIS — D72819 Decreased white blood cell count, unspecified: Secondary | ICD-10-CM | POA: Insufficient documentation

## 2022-09-12 DIAGNOSIS — I10 Essential (primary) hypertension: Secondary | ICD-10-CM | POA: Diagnosis not present

## 2022-09-12 LAB — COMPREHENSIVE METABOLIC PANEL
ALT: 10 U/L (ref 0–44)
AST: 19 U/L (ref 15–41)
Albumin: 4.6 g/dL (ref 3.5–5.0)
Alkaline Phosphatase: 52 U/L (ref 38–126)
Anion gap: 10 (ref 5–15)
BUN: 16 mg/dL (ref 8–23)
CO2: 29 mmol/L (ref 22–32)
Calcium: 10.2 mg/dL (ref 8.9–10.3)
Chloride: 99 mmol/L (ref 98–111)
Creatinine, Ser: 1.05 mg/dL — ABNORMAL HIGH (ref 0.44–1.00)
GFR, Estimated: >60 mL/min (ref >60)
Glucose, Bld: 118 mg/dL — ABNORMAL HIGH (ref 70–99)
Potassium: 3.4 mmol/L — ABNORMAL LOW (ref 3.5–5.1)
Sodium: 138 mmol/L (ref 135–145)
Total Bilirubin: 1.1 mg/dL (ref 0.3–1.2)
Total Protein: 8.5 g/dL — ABNORMAL HIGH (ref 6.5–8.1)

## 2022-09-12 LAB — CBC WITH DIFFERENTIAL/PLATELET
Abs Immature Granulocytes: 0 10*3/uL (ref 0.00–0.07)
Basophils Absolute: 0.1 10*3/uL (ref 0.0–0.1)
Basophils Relative: 1 %
Eosinophils Absolute: 0.1 10*3/uL (ref 0.0–0.5)
Eosinophils Relative: 1 %
HCT: 40.5 % (ref 36.0–46.0)
Hemoglobin: 13 g/dL (ref 12.0–15.0)
Immature Granulocytes: 0 %
Lymphocytes Relative: 58 %
Lymphs Abs: 2.1 10*3/uL (ref 0.7–4.0)
MCH: 24.1 pg — ABNORMAL LOW (ref 26.0–34.0)
MCHC: 32.1 g/dL (ref 30.0–36.0)
MCV: 75 fL — ABNORMAL LOW (ref 80.0–100.0)
Monocytes Absolute: 0.4 10*3/uL (ref 0.1–1.0)
Monocytes Relative: 10 %
Neutro Abs: 1.1 10*3/uL — ABNORMAL LOW (ref 1.7–7.7)
Neutrophils Relative %: 30 %
Platelets: 202 10*3/uL (ref 150–400)
RBC: 5.4 MIL/uL — ABNORMAL HIGH (ref 3.87–5.11)
RDW: 16.2 % — ABNORMAL HIGH (ref 11.5–15.5)
WBC: 3.6 10*3/uL — ABNORMAL LOW (ref 4.0–10.5)
nRBC: 0 % (ref 0.0–0.2)

## 2022-09-12 LAB — TROPONIN I (HIGH SENSITIVITY)
Troponin I (High Sensitivity): 6 ng/L (ref <18)
Troponin I (High Sensitivity): 5 ng/L (ref <18)

## 2022-09-12 LAB — LIPASE, BLOOD: Lipase: 44 U/L (ref 11–51)

## 2022-09-12 LAB — URINALYSIS, ROUTINE W REFLEX MICROSCOPIC
Bilirubin Urine: NEGATIVE
Glucose, UA: NEGATIVE mg/dL
Hgb urine dipstick: NEGATIVE
Ketones, ur: NEGATIVE mg/dL
Leukocytes,Ua: NEGATIVE
Nitrite: NEGATIVE
Protein, ur: NEGATIVE mg/dL
Specific Gravity, Urine: 1.003 — ABNORMAL LOW (ref 1.005–1.030)
pH: 6 (ref 5.0–8.0)

## 2022-09-12 MED ORDER — FENTANYL CITRATE PF 50 MCG/ML IJ SOSY
50.0000 ug | PREFILLED_SYRINGE | Freq: Once | INTRAMUSCULAR | Status: AC
  Administered 2022-09-12: 50 ug via INTRAVENOUS
  Filled 2022-09-12: qty 1

## 2022-09-12 MED ORDER — ONDANSETRON HCL 4 MG/2ML IJ SOLN
4.0000 mg | Freq: Once | INTRAMUSCULAR | Status: AC
  Administered 2022-09-12: 4 mg via INTRAVENOUS
  Filled 2022-09-12: qty 2

## 2022-09-12 NOTE — ED Provider Notes (Signed)
[facility] EMERGENCY DEPARTMENT Provider Note   CSN: [id number] Arrival date & time: 09/12/22  0555     History  Chief Complaint  Patient presents with   Abdominal Pain    Belinda Cunningham is a 61 y.o. female.  The history is provided by the patient.  Patient reports after taking her evening Zoloft, she began having abdominal pain and dizziness.  She is also having headache.  No fevers or vomiting.  No chest pain or shortness of breath. She is also on blood pressure medications as well. No other acute complaints Patient is a very poor historian which limits history taking    Past Medical History:  Diagnosis Date   Alopecia    Cigarette smoker    Depression    History of echocardiogram 04/04/2012   normal EF, no regional wall motion abnormalities   Hypertension    Hypertension    Non compliance w medication regimen     Home Medications Prior to Admission medications   Medication Sig Start Date End Date Taking? Authorizing Provider  lisinopril (PRINIVIL,ZESTRIL) 10 MG tablet Take 1 tablet (10 mg total) by mouth daily. 12/13/18   Eber Hong, MD  metoprolol tartrate (LOPRESSOR) 50 MG tablet Take 0.5 tablets (25 mg total) by mouth 2 (two) times daily. 12/13/18   Eber Hong, MD      Allergies    Patient has no known allergies.    Review of Systems   Review of Systems  Constitutional:  Negative for fever.  Respiratory:  Negative for shortness of breath.   Cardiovascular:  Negative for chest pain.  Gastrointestinal:  Positive for abdominal pain. Negative for vomiting.  Neurological:  Positive for dizziness.    Physical Exam Updated Vital Signs BP (!) 182/84   Pulse 70   Temp 97.8 F (36.6 C) (Oral)   Resp 18   Ht 1.676 m (5\' 6" )   Wt 67.6 kg   LMP  (LMP Unknown)   SpO2 99%   BMI 24.05 kg/m  Physical Exam CONSTITUTIONAL: Chronically ill-appearing HEAD: Normocephalic/atraumatic EYES: EOMI ENMT: Mucous membranes moist NECK: supple no meningeal  signs SPINE/BACK:entire spine nontender CV: S1/S2 noted, no murmurs/rubs/gallops noted LUNGS: Lungs are clear to auscultation bilaterally, no apparent distress ABDOMEN: soft, minimal tenderness noted, no rebound or guarding, bowel sounds noted throughout abdomen GU:no cva tenderness NEURO: Pt is awake/alert/appropriate, moves all extremitiesx4.  No facial droop.  No arm or leg drift. EXTREMITIES: pulses normal/equal, full ROM SKIN: warm, color normal PSYCH: Mildly anxious  ED Results / Procedures / Treatments   Labs (all labs ordered are listed, but only abnormal results are displayed) Labs Reviewed  CBC WITH DIFFERENTIAL/PLATELET - Abnormal; Notable for the following components:      Result Value   WBC 3.6 (*)    RBC 5.40 (*)    MCV 75.0 (*)    MCH 24.1 (*)    RDW 16.2 (*)    Neutro Abs 1.1 (*)    All other components within normal limits  COMPREHENSIVE METABOLIC PANEL  LIPASE, BLOOD  URINALYSIS, ROUTINE W REFLEX MICROSCOPIC  TROPONIN I (HIGH SENSITIVITY)    EKG EKG Interpretation  Date/Time:  Monday September 12 2022 06:39:57 EDT Ventricular Rate:  69 PR Interval:  149 QRS Duration: 95 QT Interval:  444 QTC Calculation: 476 R Axis:   71 Text Interpretation: Sinus rhythm Probable left atrial enlargement LVH with secondary repolarization abnormality Confirmed by 12-16-2004 (Zadie Rhine) on 09/12/2022 6:43:22 AM  Radiology No results found.  Procedures Procedures    Medications Ordered in ED Medications  ondansetron (ZOFRAN) injection 4 mg (4 mg Intravenous Given 09/12/22 0645)  fentaNYL (SUBLIMAZE) injection 50 mcg (50 mcg Intravenous Given 09/12/22 0645)    ED Course/ Medical Decision Making/ A&P Clinical Course as of 09/12/22 0715  Mon Sep 12, 2022  0700 Patient now reports all of her symptoms have resolved.  Denies any headache or abdominal pain at this time.  She suspects is due to taking the medicines in the evening.  We will still plan on getting labs, p.o.  trial and monitoring.  Will defer imaging for now [DW]  0711 Signed out to dr Rogene Houston at shift change [DW]    Clinical Course User Index [DW] Ripley Fraise, MD                           Medical Decision Making Amount and/or Complexity of Data Reviewed Labs: ordered. ECG/medicine tests: ordered.  Risk Prescription drug management.   This patient presents to the ED for concern of abdominal pain, this involves an extensive number of treatment options, and is a complaint that carries with it a high risk of complications and morbidity.  The differential diagnosis includes but is not limited to cholecystitis, cholelithiasis, pancreatitis, gastritis, peptic ulcer disease, appendicitis, bowel obstruction, bowel perforation, diverticulitis, AAA, ischemic bowel    Comorbidities that complicate the patient evaluation: Patient's presentation is complicated by their history of hypertension  Social Determinants of Health: Patient's  low health literacy   increases the complexity of managing their presentation  Additional history obtained: Records reviewed  outpatient records reviewed  Lab Tests: I Ordered, and personally interpreted labs.  The pertinent results include: Mild leukopenia  Cardiac Monitoring: The patient was maintained on a cardiac monitor.  I personally viewed and interpreted the cardiac monitor which showed an underlying rhythm of:  sinus rhythm  Medicines ordered and prescription drug management: I ordered medication including fentanyl and Zofran for pain and nausea Reevaluation of the patient after these medicines showed that the patient    improved  Test Considered: Considered CT imaging, patient is now feeling improved  Reevaluation: After the interventions noted above, I reevaluated the patient and found that they have :improved  Complexity of problems addressed: Patient's presentation is most consistent with  acute presentation with potential threat to life  or bodily function  Disposition: Pending at time of signout       Final Clinical Impression(s) / ED Diagnoses Final diagnoses:  Generalized abdominal pain  Dizziness    Rx / DC Orders ED Discharge Orders     None         Ripley Fraise, MD 09/12/22 0715

## 2022-09-12 NOTE — ED Triage Notes (Signed)
Pt c/o abd pain with dizziness and nausea since last night; pt states she feels as if symptoms are related to new medications she started taking on 08/31/22, medications hydrochlorothiazide, sertraline, amlodipine; denies v/d/fever

## 2022-09-12 NOTE — Discharge Instructions (Signed)
I would hold on taking the new medicine talk to your regular doctor about it.  Continue to take your blood pressure medicines though.  Return for new or worse symptoms.  Glad to hear that the abdominal pain is resolved.  Work note provided

## 2022-09-12 NOTE — ED Provider Notes (Signed)
Patient is abdominal pain is stayed away.  May be due to taking her new medicine Citrulline.  Patient will continue her antihypertensive meds she has been taking them long-term without any problems.  Patient has follow-up with her doctor on Wednesday.  Recommend that she hold the sertraline for now.   Fredia Sorrow, MD 09/12/22 [phone]

## 2022-09-13 ENCOUNTER — Telehealth: Payer: Self-pay

## 2022-09-13 NOTE — Patient Outreach (Signed)
Care Coordination  09/13/2022  Xan Sparkman Yassin 10-Jul-[id number]   Transition Care Management Unsuccessful Follow-up Telephone Call  Date of discharge and from where:  09/12/22 [facility]  Attempts:  1st Attempt  Reason for unsuccessful TCM follow-up call:  Left voice message   Mickel Fuchs, [place], [place] Medicaid Team  [phone]

## 2022-09-16 DIAGNOSIS — F411 Generalized anxiety disorder: Secondary | ICD-10-CM | POA: Diagnosis not present

## 2022-09-16 DIAGNOSIS — Z0001 Encounter for general adult medical examination with abnormal findings: Secondary | ICD-10-CM | POA: Diagnosis not present

## 2022-09-16 DIAGNOSIS — I10 Essential (primary) hypertension: Secondary | ICD-10-CM | POA: Diagnosis not present

## 2022-12-09 DIAGNOSIS — I10 Essential (primary) hypertension: Secondary | ICD-10-CM | POA: Diagnosis not present

## 2022-12-09 DIAGNOSIS — F411 Generalized anxiety disorder: Secondary | ICD-10-CM | POA: Diagnosis not present

## 2023-03-10 DIAGNOSIS — I10 Essential (primary) hypertension: Secondary | ICD-10-CM | POA: Diagnosis not present

## 2023-03-10 DIAGNOSIS — F17210 Nicotine dependence, cigarettes, uncomplicated: Secondary | ICD-10-CM | POA: Diagnosis not present

## 2023-03-10 DIAGNOSIS — F411 Generalized anxiety disorder: Secondary | ICD-10-CM | POA: Diagnosis not present

## 2023-03-20 DIAGNOSIS — S0083XA Contusion of other part of head, initial encounter: Secondary | ICD-10-CM | POA: Diagnosis not present

## 2023-03-20 DIAGNOSIS — I10 Essential (primary) hypertension: Secondary | ICD-10-CM | POA: Diagnosis not present

## 2023-03-20 DIAGNOSIS — W1830XD Fall on same level, unspecified, subsequent encounter: Secondary | ICD-10-CM | POA: Diagnosis not present

## 2023-03-21 ENCOUNTER — Other Ambulatory Visit (HOSPITAL_COMMUNITY): Payer: Self-pay | Admitting: Gerontology

## 2023-03-21 DIAGNOSIS — W1830XD Fall on same level, unspecified, subsequent encounter: Secondary | ICD-10-CM

## 2023-08-29 ENCOUNTER — Telehealth: Payer: Self-pay

## 2023-08-29 NOTE — Telephone Encounter (Signed)
Medicaid Managed Care   Unsuccessful Outreach Note  08/29/2023 Name: Belinda Cunningham MRN: [id number] DOB: [birth year]  Referred by: [facility] Reason for referral : No chief complaint on file.   An unsuccessful telephone outreach was attempted today. The patient was referred to the case management team for assistance with care management and care coordination.   Follow Up Plan: If patient returns call to provider office, please advise to call Embedded Care Management Care Guide Nicholes Rough* at [phone]*  Nicholes Rough, CMA Care Guide [employer]

## 2023-09-11 DIAGNOSIS — Z23 Encounter for immunization: Secondary | ICD-10-CM | POA: Diagnosis not present

## 2023-09-11 DIAGNOSIS — F411 Generalized anxiety disorder: Secondary | ICD-10-CM | POA: Diagnosis not present

## 2023-09-11 DIAGNOSIS — I10 Essential (primary) hypertension: Secondary | ICD-10-CM | POA: Diagnosis not present

## 2023-10-12 DIAGNOSIS — F411 Generalized anxiety disorder: Secondary | ICD-10-CM | POA: Diagnosis not present

## 2023-10-12 DIAGNOSIS — I10 Essential (primary) hypertension: Secondary | ICD-10-CM | POA: Diagnosis not present

## 2023-10-12 DIAGNOSIS — Z0001 Encounter for general adult medical examination with abnormal findings: Secondary | ICD-10-CM | POA: Diagnosis not present

## 2024-01-12 DIAGNOSIS — F411 Generalized anxiety disorder: Secondary | ICD-10-CM | POA: Diagnosis not present

## 2024-01-12 DIAGNOSIS — E559 Vitamin D deficiency, unspecified: Secondary | ICD-10-CM | POA: Diagnosis not present

## 2024-01-12 DIAGNOSIS — I10 Essential (primary) hypertension: Secondary | ICD-10-CM | POA: Diagnosis not present

## 2024-02-02 DIAGNOSIS — H00013 Hordeolum externum right eye, unspecified eyelid: Secondary | ICD-10-CM | POA: Diagnosis not present

## 2024-02-02 DIAGNOSIS — I10 Essential (primary) hypertension: Secondary | ICD-10-CM | POA: Diagnosis not present

## 2024-04-11 DIAGNOSIS — I10 Essential (primary) hypertension: Secondary | ICD-10-CM | POA: Diagnosis not present

## 2024-04-11 DIAGNOSIS — E559 Vitamin D deficiency, unspecified: Secondary | ICD-10-CM | POA: Diagnosis not present

## 2024-04-11 DIAGNOSIS — F411 Generalized anxiety disorder: Secondary | ICD-10-CM | POA: Diagnosis not present

## 2024-08-08 DIAGNOSIS — Z23 Encounter for immunization: Secondary | ICD-10-CM | POA: Diagnosis not present

## 2024-08-08 DIAGNOSIS — I10 Essential (primary) hypertension: Secondary | ICD-10-CM | POA: Diagnosis not present

## 2024-08-08 DIAGNOSIS — F411 Generalized anxiety disorder: Secondary | ICD-10-CM | POA: Diagnosis not present

## 2024-10-03 DIAGNOSIS — I10 Essential (primary) hypertension: Secondary | ICD-10-CM | POA: Diagnosis not present

## 2024-10-03 DIAGNOSIS — M79609 Pain in unspecified limb: Secondary | ICD-10-CM | POA: Diagnosis not present

## 2024-10-03 DIAGNOSIS — L84 Corns and callosities: Secondary | ICD-10-CM | POA: Diagnosis not present

## 2024-10-03 DIAGNOSIS — L853 Xerosis cutis: Secondary | ICD-10-CM | POA: Diagnosis not present
